# Patient Record
Sex: Female | Born: 1990 | Race: White | Hispanic: No | Marital: Single | Smoking: Former smoker
Health system: Southern US, Community
[De-identification: ages and names within clinical notes are randomized; demographics above are authoritative.]

## PROBLEM LIST (undated history)

## (undated) DIAGNOSIS — R569 Unspecified convulsions: Secondary | ICD-10-CM

## (undated) HISTORY — PX: TUMOR REMOVAL: SHX12

## (undated) HISTORY — PX: WISDOM TOOTH EXTRACTION: SHX21

## (undated) HISTORY — PX: TONSILLECTOMY: SUR1361

---
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
## 2009-12-12 ENCOUNTER — Emergency Department (HOSPITAL_COMMUNITY): Admission: EM | Admit: 2009-12-12 | Discharge: 2009-12-12 | Payer: Self-pay | Admitting: Emergency Medicine

## 2010-06-30 ENCOUNTER — Emergency Department (HOSPITAL_COMMUNITY)
Admission: EM | Admit: 2010-06-30 | Discharge: 2010-06-30 | Disposition: A | Discharge status: Home / Self Care | Payer: BC Managed Care – PPO | Attending: Emergency Medicine | Admitting: Emergency Medicine

## 2010-06-30 ENCOUNTER — Emergency Department (HOSPITAL_COMMUNITY): Payer: BC Managed Care – PPO

## 2010-06-30 DIAGNOSIS — R569 Unspecified convulsions: Secondary | ICD-10-CM | POA: Insufficient documentation

## 2010-06-30 DIAGNOSIS — F29 Unspecified psychosis not due to a substance or known physiological condition: Secondary | ICD-10-CM | POA: Insufficient documentation

## 2010-06-30 DIAGNOSIS — R110 Nausea: Secondary | ICD-10-CM | POA: Insufficient documentation

## 2010-06-30 LAB — COMPREHENSIVE METABOLIC PANEL
AST: 25 U/L (ref 0–37)
Albumin: 4.2 g/dL (ref 3.5–5.2)
Chloride: 106 mEq/L (ref 96–112)
Creatinine, Ser: 0.58 mg/dL (ref 0.4–1.2)
GFR calc Af Amer: >60 mL/min (ref >60)
Total Bilirubin: 0.7 mg/dL (ref 0.3–1.2)

## 2010-06-30 LAB — DIFFERENTIAL
Basophils Absolute: 0 10*3/uL (ref 0.0–0.1)
Basophils Relative: 0 % (ref 0–1)
Eosinophils Absolute: 0 10*3/uL (ref 0.0–0.7)
Eosinophils Relative: 0 % (ref 0–5)
Lymphocytes Relative: 9 % — ABNORMAL LOW (ref 12–46)
Lymphs Abs: 1.4 10*3/uL (ref 0.7–4.0)
Monocytes Absolute: 0.8 10*3/uL (ref 0.1–1.0)
Monocytes Relative: 5 % (ref 3–12)
Neutro Abs: 12.5 10*3/uL — ABNORMAL HIGH (ref 1.7–7.7)
Neutrophils Relative %: 85 % — ABNORMAL HIGH (ref 43–77)

## 2010-06-30 LAB — URINE MICROSCOPIC-ADD ON

## 2010-06-30 LAB — RAPID URINE DRUG SCREEN, HOSP PERFORMED
Barbiturates: NOT DETECTED
Cocaine: NOT DETECTED
Opiates: NOT DETECTED
Tetrahydrocannabinol: NOT DETECTED

## 2010-06-30 LAB — URINALYSIS, ROUTINE W REFLEX MICROSCOPIC
Bilirubin Urine: NEGATIVE
Hgb urine dipstick: NEGATIVE
Specific Gravity, Urine: 1.024 (ref 1.005–1.030)
Urine Glucose, Fasting: NEGATIVE mg/dL
pH: 6 (ref 5.0–8.0)

## 2010-06-30 LAB — CBC
MCHC: 35.3 g/dL (ref 30.0–36.0)
WBC: 14.7 10*3/uL — ABNORMAL HIGH (ref 4.0–10.5)

## 2010-06-30 LAB — SALICYLATE LEVEL: Salicylate Lvl: <4 mg/dL (ref 2.8–20.0)

## 2010-06-30 LAB — PHOSPHORUS: Phosphorus: 2.4 mg/dL (ref 2.3–4.6)

## 2010-06-30 LAB — POCT PREGNANCY, URINE: Preg Test, Ur: NEGATIVE

## 2010-06-30 LAB — ACETAMINOPHEN LEVEL: Acetaminophen (Tylenol), Serum: <10 ug/mL — ABNORMAL LOW (ref 10–30)

## 2010-06-30 MED ORDER — GADOBENATE DIMEGLUMINE 529 MG/ML IV SOLN
15.0000 mL | Freq: Once | INTRAVENOUS | Status: AC
  Administered 2010-06-30: 15 mL via INTRAVENOUS

## 2010-08-05 LAB — URINALYSIS, ROUTINE W REFLEX MICROSCOPIC
Bilirubin Urine: NEGATIVE
Ketones, ur: NEGATIVE mg/dL
Nitrite: NEGATIVE
Protein, ur: NEGATIVE mg/dL

## 2010-08-05 LAB — URINE CULTURE: Culture  Setup Time: 201107260125

## 2010-08-05 LAB — URINE MICROSCOPIC-ADD ON

## 2011-07-24 ENCOUNTER — Encounter (INDEPENDENT_AMBULATORY_CARE_PROVIDER_SITE_OTHER): Payer: BC Managed Care – PPO | Admitting: Obstetrics and Gynecology

## 2011-07-24 DIAGNOSIS — N949 Unspecified condition associated with female genital organs and menstrual cycle: Secondary | ICD-10-CM

## 2011-07-24 DIAGNOSIS — N390 Urinary tract infection, site not specified: Secondary | ICD-10-CM

## 2011-07-24 DIAGNOSIS — Z331 Pregnant state, incidental: Secondary | ICD-10-CM

## 2011-07-24 DIAGNOSIS — R300 Dysuria: Secondary | ICD-10-CM

## 2011-07-25 ENCOUNTER — Other Ambulatory Visit (INDEPENDENT_AMBULATORY_CARE_PROVIDER_SITE_OTHER): Payer: BC Managed Care – PPO

## 2011-07-25 ENCOUNTER — Encounter (INDEPENDENT_AMBULATORY_CARE_PROVIDER_SITE_OTHER): Payer: BC Managed Care – PPO | Admitting: Obstetrics and Gynecology

## 2011-07-25 DIAGNOSIS — O26849 Uterine size-date discrepancy, unspecified trimester: Secondary | ICD-10-CM

## 2011-09-18 ENCOUNTER — Telehealth: Payer: Self-pay | Admitting: Obstetrics and Gynecology

## 2011-09-18 NOTE — Telephone Encounter (Signed)
Routed to triage 

## 2011-09-20 NOTE — Telephone Encounter (Signed)
Lm on vm all labs wnl

## 2011-09-20 NOTE — Telephone Encounter (Signed)
Lm on vm tcb rgd msg 

## 2017-07-23 ENCOUNTER — Emergency Department (INDEPENDENT_AMBULATORY_CARE_PROVIDER_SITE_OTHER)
Admission: EM | Admit: 2017-07-23 | Discharge: 2017-07-23 | Disposition: A | Discharge status: Home / Self Care | Payer: Managed Care, Other (non HMO) | Source: Home / Self Care

## 2017-07-23 ENCOUNTER — Other Ambulatory Visit: Payer: Self-pay

## 2017-07-23 DIAGNOSIS — L739 Follicular disorder, unspecified: Secondary | ICD-10-CM

## 2017-07-23 DIAGNOSIS — R300 Dysuria: Secondary | ICD-10-CM

## 2017-07-23 LAB — POCT URINALYSIS DIP (MANUAL ENTRY)
BILIRUBIN UA: NEGATIVE
Glucose, UA: NEGATIVE mg/dL
NITRITE UA: NEGATIVE
Protein Ur, POC: 30 mg/dL — AB
Spec Grav, UA: 1.025 (ref 1.010–1.025)
Urobilinogen, UA: 0.2 E.U./dL
pH, UA: 6 (ref 5.0–8.0)

## 2017-07-23 MED ORDER — DOXYCYCLINE HYCLATE 100 MG PO CAPS: 100.0000 mg | ORAL_CAPSULE | Freq: Two times a day (BID) | ORAL | 0 refills | Status: DC

## 2017-07-23 NOTE — ED Provider Notes (Signed)
[facility]    CSN: [id number] Arrival date & time: 07/23/17  0906     History   Chief Complaint Chief Complaint  Patient presents with  . Dysuria    HPI Maureen Castaneda is a 27 y.o. female.   The history is provided by the patient. No language interpreter was used.  Dysuria  Pain quality:  Aching Pain severity:  Moderate Onset quality:  Gradual Timing:  Constant Progression:  Worsening Chronicity:  New Recent urinary tract infections: no   Relieved by:  Nothing Worsened by:  Nothing Ineffective treatments:  None tried Urinary symptoms comment:  Burning Associated symptoms: no abdominal pain, no fever, no vaginal discharge and no vomiting   Risk factors: not pregnant     History reviewed. No pertinent past medical history.  There are no active problems to display for this patient.   Past Surgical History:  Procedure Laterality Date  . TONSILLECTOMY    . WISDOM TOOTH EXTRACTION      OB History    No data available       Home Medications    Prior to Admission medications   Medication Sig Start Date End Date Taking? Authorizing Provider  doxycycline (VIBRAMYCIN) 100 MG capsule Take 1 capsule (100 mg total) by mouth 2 (two) times daily. 07/23/17   Elson AreasSofia, Zabria Liss K, PA-C    Family History History reviewed. No pertinent family history.  Social History Social History   Tobacco Use  . Smoking status: Never Smoker  . Smokeless tobacco: Never Used  Substance Use Topics  . Alcohol use: Yes    Frequency: Never  . Drug use: No     Allergies   Patient has no known allergies.   Review of Systems Review of Systems  Constitutional: Negative for chills and fever.  Gastrointestinal: Negative for abdominal pain and vomiting.  Genitourinary: Positive for dysuria. Negative for hematuria, menstrual problem, vaginal bleeding and vaginal discharge.  Skin: Negative for color change and rash.  All other systems reviewed and are  negative.    Physical Exam Triage Vital Signs ED Triage Vitals  Enc Vitals Group     BP 07/23/17 0950 115/76     Pulse Rate 07/23/17 0950 79     Resp --      Temp 07/23/17 0950 98.1 F (36.7 C)     Temp Source 07/23/17 0950 Oral     SpO2 07/23/17 0950 97 %     Weight 07/23/17 0951 198 lb (89.8 kg)     Height 07/23/17 0951 5\' 2"  (1.575 m)     Head Circumference --      Peak Flow --      Pain Score 07/23/17 0950 6     Pain Loc --      Pain Edu? --      Excl. in GC? --    No data found.  Updated Vital Signs BP 115/76 (BP Location: Right Arm)   Pulse 79   Temp 98.1 F (36.7 C) (Oral)   Ht 5\' 2"  (1.575 m)   Wt 198 lb (89.8 kg)   LMP 07/11/2017   SpO2 97%   BMI 36.21 kg/m   Visual Acuity Right Eye Distance:   Left Eye Distance:   Bilateral Distance:    Right Eye Near:   Left Eye Near:    Bilateral Near:     Physical Exam  Constitutional: She appears well-developed and well-nourished.  HENT:  Head: Normocephalic and atraumatic.  Eyes: Conjunctivae are  normal. Pupils are equal, round, and reactive to light.  Neck: Normal range of motion. Neck supple.  Cardiovascular: Normal rate.  Abdominal: Soft. There is no tenderness.  Genitourinary:  Genitourinary Comments: 3 small pimples labial area,  Area looks more like infected hair follicles   Musculoskeletal: Normal range of motion.  Skin: Skin is warm.     UC Treatments / Results  Labs (all labs ordered are listed, but only abnormal results are displayed) Labs Reviewed  POCT URINALYSIS DIP (MANUAL ENTRY) - Abnormal; Notable for the following components:      Result Value   Ketones, POC UA trace (5) (*)    Blood, UA trace-intact (*)    Protein Ur, POC =30 (*)    Leukocytes, UA Trace (*)    All other components within normal limits  C. TRACHOMATIS/N. GONORRHOEAE RNA  WET PREP BY MOLECULAR PROBE  URINE CULTURE  RPR  HIV ANTIBODY (ROUTINE TESTING)    EKG  EKG Interpretation None        Radiology No results found.  Procedures Procedures (including critical care time)  Medications Ordered in UC Medications - No data to display   Initial Impression / Assessment and Plan / UC Course  I have reviewed the triage vital signs and the nursing notes.  Pertinent labs & imaging results that were available during my care of the patient were reviewed by me and considered in my medical decision making (see chart for details).     MDM  Std testing ordered.  I will treat with doxycycline to cover for skin infection.  Pt counseled on possible herpes.  I advised areas will worsen if herptic  Final Clinical Impressions(s) / UC Diagnoses   Final diagnoses:  Dysuria  Folliculitis    ED Discharge Orders        Ordered    doxycycline (VIBRAMYCIN) 100 MG capsule  2 times daily     07/23/17 1035       Controlled Substance Prescriptions [place] Controlled Substance Registry consulted? Not Applicable   Elson Areas, [place] 07/23/17 4132

## 2017-07-23 NOTE — ED Triage Notes (Signed)
Started Thursday with painful urination.  Had used AZO, but not for a couple of days.  Also concerned about STD. #[id number] PW [phone]

## 2017-07-23 NOTE — Discharge Instructions (Signed)
Return if any problems.

## 2017-07-24 LAB — WET PREP BY MOLECULAR PROBE
Candida species: NOT DETECTED
MICRO NUMBER: 90281490
SPECIMEN QUALITY: ADEQUATE
Trichomonas vaginosis: NOT DETECTED

## 2017-07-24 LAB — C. TRACHOMATIS/N. GONORRHOEAE RNA
C. trachomatis RNA, TMA: NOT DETECTED
N. GONORRHOEAE RNA, TMA: NOT DETECTED

## 2017-07-24 LAB — URINE CULTURE
MICRO NUMBER:: 90281489
SPECIMEN QUALITY: ADEQUATE

## 2017-07-24 LAB — RPR: RPR: NONREACTIVE

## 2017-07-24 LAB — HIV ANTIBODY (ROUTINE TESTING W REFLEX): HIV 1&2 Ab, 4th Generation: NONREACTIVE

## 2017-07-25 ENCOUNTER — Telehealth: Payer: Self-pay

## 2017-07-25 NOTE — Telephone Encounter (Signed)
Notified patient of lab results.  Medication called into pharmacy.

## 2020-05-11 ENCOUNTER — Ambulatory Visit: Payer: Self-pay | Admitting: Physician Assistant

## 2020-05-11 ENCOUNTER — Other Ambulatory Visit: Payer: Self-pay

## 2020-05-11 ENCOUNTER — Encounter: Payer: Self-pay | Admitting: Physician Assistant

## 2020-05-11 DIAGNOSIS — Z113 Encounter for screening for infections with a predominantly sexual mode of transmission: Secondary | ICD-10-CM

## 2020-05-11 DIAGNOSIS — G40909 Epilepsy, unspecified, not intractable, without status epilepticus: Secondary | ICD-10-CM

## 2020-05-11 DIAGNOSIS — Z299 Encounter for prophylactic measures, unspecified: Secondary | ICD-10-CM

## 2020-05-11 LAB — PREGNANCY, URINE: Preg Test, Ur: NEGATIVE

## 2020-05-11 LAB — WET PREP FOR TRICH, YEAST, CLUE
Trichomonas Exam: NEGATIVE
Yeast Exam: NEGATIVE

## 2020-05-11 MED ORDER — AZITHROMYCIN 500 MG PO TABS
1000.0000 mg | ORAL_TABLET | Freq: Once | ORAL | Status: AC
  Administered 2020-05-11: 17:00:00 1000 mg via ORAL

## 2020-05-11 NOTE — Progress Notes (Signed)
  [facility] Department STI clinic/screening visit  Subjective:  Maureen Castaneda is a 29 y.o. female being seen today for an STI screening visit. The patient reports they do not have symptoms.  Patient reports that they do not desire a pregnancy in the next year.   They reported they are not interested in discussing contraception today.  Patient's last menstrual period was 04/08/2020.   Patient has the following medical conditions:  There are no problems to display for this patient.   Chief Complaint  Patient presents with  . SEXUALLY TRANSMITTED DISEASE    screening    HPI  Patient reports that she is not having any symptoms but that her partner was having symptoms and got treated with Doxycycline.  Reports that she has a seizure disorder but that she does not take any medicines for this currently.  Reports that she recently started on the Patch as her [facility] and her periods have been "weird" so would like a pregnancy test to confirm that she is not pregnant.  States last HIV test and pap were 2 years ago.   See flowsheet for further details and programmatic requirements.    The following portions of the patient's history were reviewed and updated as appropriate: allergies, current medications, past medical history, past social history, past surgical history and problem list.  Objective:  There were no vitals filed for this visit.  Physical Exam Constitutional:      General: She is not in acute distress.    Appearance: Normal appearance.  HENT:     Head: Normocephalic and atraumatic.     Comments: No nits,lice, or hair loss. No cervical, supraclavicular or axillary adenopathy.    Mouth/Throat:     Mouth: Mucous membranes are moist.     Pharynx: Oropharynx is clear. No oropharyngeal exudate or posterior oropharyngeal erythema.  Eyes:     Conjunctiva/sclera: Conjunctivae normal.  Pulmonary:     Effort: Pulmonary effort is normal.  Musculoskeletal:     Cervical  back: Neck supple. No tenderness.  Skin:    General: Skin is warm and dry.     Findings: No bruising, erythema, lesion or rash.  Neurological:     Mental Status: She is alert and oriented to person, place, and time.  Psychiatric:        Mood and Affect: Mood normal.        Behavior: Behavior normal.        Thought Content: Thought content normal.        Judgment: Judgment normal.      Assessment and Plan:  Maureen Castaneda is a 29 y.o. female presenting to the [facility] Department for STI screening  1. Screening for STD (sexually transmitted disease) Patient into clinic without symptoms. Rec condoms with all sex. Await test results.  Counseled that RN will call if needs to RTC for treatment once results are back. - Pregnancy, urine - WET PREP FOR TRICH, YEAST, CLUE - Gonococcus culture - HIV [place] [facility] - Syphilis Serology, [place] [facility] - Chlamydia/Gonorrhea [facility] [place] [facility]  2. Prophylactic measure Will cover patient for possible contact to Chlamydia or NGU with Azithromycin 1 g po DOT today. No sex for 10 days and until after partner completes treatment. RTC for re-treatment if vomits < 2 hr after taking medicine. - azithromycin (ZITHROMAX) tablet 1,000 mg     No follow-ups on file.  No future appointments.  Matt Holmes, PA

## 2020-05-11 NOTE — Progress Notes (Signed)
Post:  RN reviewed wet mount with patient. Patient treated as contact with 1 gram azithromycin. Provider orders complete.   Harvie Heck, RN

## 2020-05-16 LAB — GONOCOCCUS CULTURE

## 2020-05-25 DIAGNOSIS — A749 Chlamydial infection, unspecified: Secondary | ICD-10-CM

## 2021-03-09 ENCOUNTER — Emergency Department (HOSPITAL_BASED_OUTPATIENT_CLINIC_OR_DEPARTMENT_OTHER): Payer: BC Managed Care – PPO

## 2021-03-09 ENCOUNTER — Other Ambulatory Visit: Payer: Self-pay

## 2021-03-09 ENCOUNTER — Encounter (HOSPITAL_BASED_OUTPATIENT_CLINIC_OR_DEPARTMENT_OTHER): Payer: Self-pay

## 2021-03-09 ENCOUNTER — Emergency Department (HOSPITAL_BASED_OUTPATIENT_CLINIC_OR_DEPARTMENT_OTHER)
Admission: EM | Admit: 2021-03-09 | Discharge: 2021-03-09 | Disposition: A | Discharge status: Home / Self Care | Payer: BC Managed Care – PPO | Attending: Emergency Medicine | Admitting: Emergency Medicine

## 2021-03-09 DIAGNOSIS — R6884 Jaw pain: Secondary | ICD-10-CM | POA: Diagnosis not present

## 2021-03-09 DIAGNOSIS — S0990XA Unspecified injury of head, initial encounter: Secondary | ICD-10-CM

## 2021-03-09 DIAGNOSIS — W500XXA Accidental hit or strike by another person, initial encounter: Secondary | ICD-10-CM | POA: Insufficient documentation

## 2021-03-09 DIAGNOSIS — Y9361 Activity, american tackle football: Secondary | ICD-10-CM | POA: Insufficient documentation

## 2021-03-09 HISTORY — DX: Unspecified convulsions: R56.9

## 2021-03-09 NOTE — ED Triage Notes (Signed)
Pt reports facial injury 10/16-pain to left side of face with swelling bruising to chin-NAD-steady gait

## 2021-03-09 NOTE — Discharge Instructions (Signed)
Please continue to monitor your symptoms closely.  If you develop any new or worsening symptoms please come back to the emergency department.  It was a pleasure to meet you. 

## 2021-03-09 NOTE — ED Provider Notes (Signed)
[facility] EMERGENCY DEPARTMENT Provider Note   CSN: [id number] Arrival date & time: 03/09/21  1637     History Chief Complaint  Patient presents with   Facial Injury    Maureen Castaneda is a 30 y.o. female.  HPI Patient is a 30 year old female with a medical history as noted below.  She presents to the emergency department due to a head injury that occurred 4 days ago.  Patient states she was playing flag football and another player hit her left jaw with her head.  She states that she went down to the ground and was "dazed" but did not completely lose consciousness.  She states [facility] was evaluated [facility] ago and had an x-ray of her head and was discharged home.  She followed up with her PCP who recommended that she come to the emergency department for CT scans.  Since being struck to the head she reports recurrent daily headaches, photophobia.  She notes that she has continued to have significant left jaw pain with bruising to the left chin.  Pain worsens with chewing and jaw movement.  Denies any visual changes, numbness, or weakness.    Past Medical History:  Diagnosis Date   Seizures [facility])     Patient Active Problem List   Diagnosis Date Noted   Seizure disorder (HCC) 05/11/2020    Past Surgical History:  Procedure Laterality Date   TONSILLECTOMY     WISDOM TOOTH EXTRACTION       OB History   No obstetric history on file.     No family history on file.  Social History   Tobacco Use   Smoking status: Never   Smokeless tobacco: Never  Vaping Use   Vaping Use: Never used  Substance Use Topics   Alcohol use: Yes    Comment: occ   Drug use: Not Currently    Home Medications Prior to Admission medications   Medication Sig Start Date End Date Taking? Authorizing Provider  Burr Medico 150-35 MCG/24HR transdermal patch 1 patch once a week. 03/21/20   [provider]    Allergies    Patient has no known allergies.  Review of  Systems   Review of Systems  Eyes:  Positive for photophobia. Negative for visual disturbance.  Musculoskeletal:  Negative for back pain and neck pain.  Neurological:  Positive for light-headedness and headaches. Negative for syncope, weakness and numbness.   Physical Exam Updated Vital Signs BP 123/76 (BP Location: Left Arm)   Pulse 72   Temp 98.5 F (36.9 C) (Oral)   Resp 18   Ht 5\' 2"  (1.575 m)   Wt 84.8 kg   LMP 02/23/2021   SpO2 100%   BMI 34.19 kg/m   Physical Exam Vitals and nursing note reviewed.  Constitutional:      General: She is not in acute distress.    Appearance: Normal appearance. She is not ill-appearing, toxic-appearing or diaphoretic.  HENT:     Head: Normocephalic.     Right Ear: Tympanic membrane, ear canal and external ear normal. There is no impacted cerumen.     Left Ear: Tympanic membrane, ear canal and external ear normal. There is no impacted cerumen.     Nose: Nose normal.     Mouth/Throat:     Mouth: Mucous membranes are moist.     Pharynx: Oropharynx is clear. No oropharyngeal exudate or posterior oropharyngeal erythema.     Comments: Moderate tenderness noted diffusely along the  left mandibular region.  Ecchymosis noted to the left side of the chin.  Mild edema noted in the region.  No malocclusion. Eyes:     General: No scleral icterus.       Right eye: No discharge.        Left eye: No discharge.     Extraocular Movements: Extraocular movements intact.     Conjunctiva/sclera: Conjunctivae normal.  Neck:     Comments: No midline C, T, or L-spine tenderness. Cardiovascular:     Rate and Rhythm: Normal rate and regular rhythm.     Pulses: Normal pulses.     Heart sounds: Normal heart sounds. No murmur heard.   No friction rub. No gallop.  Pulmonary:     Effort: Pulmonary effort is normal. No respiratory distress.     Breath sounds: Normal breath sounds. No stridor. No wheezing, rhonchi or rales.  Abdominal:     General: Abdomen is  flat.     Tenderness: There is no abdominal tenderness.  Musculoskeletal:        General: Normal range of motion.     Cervical back: Normal range of motion and neck supple. No tenderness.  Skin:    General: Skin is warm and dry.  Neurological:     General: No focal deficit present.     Mental Status: She is alert and oriented to person, place, and time.     Comments: Patient is oriented to person, place, and time. Patient phonates in clear, complete, and coherent sentences. Strength is 5/5 in all four extremities. Distal sensation intact in all four extremities.  Psychiatric:        Mood and Affect: Mood normal.        Behavior: Behavior normal.   ED Results / Procedures / Treatments   Labs (all labs ordered are listed, but only abnormal results are displayed) Labs Reviewed - No data to display  EKG None  Radiology CT Head Wo Contrast  Result Date: 03/09/2021 CLINICAL DATA:  Football injury 5 days ago, facial bruising and swelling, headache EXAM: CT HEAD WITHOUT CONTRAST TECHNIQUE: Contiguous axial images were obtained from the base of the skull through the vertex without intravenous contrast. COMPARISON:  06/30/2010 FINDINGS: Brain: No acute infarct or hemorrhage. Lateral ventricles and midline structures are unremarkable. No acute extra-axial fluid collections. No mass effect. Vascular: No hyperdense vessel or unexpected calcification. Skull: Normal. Negative for fracture or focal lesion. Sinuses/Orbits: No acute finding. Other: None. IMPRESSION: 1. No acute intracranial process. Electronically Signed   By: Sharlet Salina M.D.   On: 03/09/2021 18:53   CT Maxillofacial Wo Contrast  Result Date: 03/09/2021 CLINICAL DATA:  Football injury, left facial bruising and swelling EXAM: CT MAXILLOFACIAL WITHOUT CONTRAST TECHNIQUE: Multidetector CT imaging of the maxillofacial structures was performed. Multiplanar CT image reconstructions were also generated. COMPARISON:  None. FINDINGS:  Osseous: No fracture or mandibular dislocation. No destructive process. Orbits: Negative. No traumatic or inflammatory finding. Sinuses: Clear. Soft tissues: Negative. Limited intracranial: No significant or unexpected finding. IMPRESSION: 1. No acute facial bone fracture. Electronically Signed   By: Sharlet Salina M.D.   On: 03/09/2021 18:56    Procedures Procedures   Medications Ordered in ED Medications - No data to display  ED Course  I have reviewed the triage vital signs and the nursing notes.  Pertinent labs & imaging results that were available during my care of the patient were reviewed by me and considered in my medical decision making (see chart for  details).    MDM Rules/Calculators/A&P                          Pt is a 30 y.o. female who presents to the emergency department due to head injury that occurred 4 days ago.  Imaging: CT scan of the head without contrast shows no acute intracranial process. CT maxillofacial without contrast shows no acute facial bone fracture.  I, Placido Sou, PA-C, personally reviewed and evaluated these images and lab results as part of my medical decision-making.  Physical exam concerning for moderate tenderness along the left mandibular region with associated ecchymosis and mild swelling to the left chin.  No malocclusion.  Neurological exam is reassuring.  I obtained CT scans of the head and maxillofacial regions which were reassuring.  Feel that her recurrent headaches and photophobia are likely postconcussive syndrome.  Recommended application of ice as well as continued use of Tylenol/ibuprofen for her symptoms.  Feel that the patient is stable for discharge at this time and she is agreeable.  We discussed return precautions in length.  Her questions were answered and she was amicable at the time of discharge.  Note: Portions of this report may have been transcribed using voice recognition software. Every effort was made to ensure  accuracy; however, inadvertent computerized transcription errors may be present.   Final Clinical Impression(s) / ED Diagnoses Final diagnoses:  Traumatic injury of head, initial encounter  Jaw pain   Rx / DC Orders ED Discharge Orders     None        Placido Sou, PA-C 03/09/21 1917    Benjiman Core, MD 03/10/21 [id number]

## 2021-03-09 NOTE — ED Notes (Signed)
Patient transported to CT 

## 2021-10-24 ENCOUNTER — Ambulatory Visit: Payer: Self-pay

## 2021-11-09 ENCOUNTER — Ambulatory Visit: Payer: Self-pay

## 2021-11-09 ENCOUNTER — Telehealth: Payer: Self-pay | Admitting: Family Medicine

## 2021-11-09 NOTE — Telephone Encounter (Signed)
Pt has been rescheduled for 11/16/21 but she states that she is out of her birth control. Please call.

## 2021-11-16 ENCOUNTER — Encounter: Payer: Self-pay | Admitting: Advanced Practice Midwife

## 2021-11-16 ENCOUNTER — Ambulatory Visit (LOCAL_COMMUNITY_HEALTH_CENTER): Payer: Self-pay | Admitting: Advanced Practice Midwife

## 2021-11-16 VITALS — BP 108/64 | Ht 62.0 in | Wt 184.2 lb

## 2021-11-16 DIAGNOSIS — Z3202 Encounter for pregnancy test, result negative: Secondary | ICD-10-CM

## 2021-11-16 DIAGNOSIS — G40909 Epilepsy, unspecified, not intractable, without status epilepticus: Secondary | ICD-10-CM

## 2021-11-16 DIAGNOSIS — A749 Chlamydial infection, unspecified: Secondary | ICD-10-CM | POA: Insufficient documentation

## 2021-11-16 DIAGNOSIS — R030 Elevated blood-pressure reading, without diagnosis of hypertension: Secondary | ICD-10-CM | POA: Insufficient documentation

## 2021-11-16 DIAGNOSIS — E669 Obesity, unspecified: Secondary | ICD-10-CM | POA: Insufficient documentation

## 2021-11-16 DIAGNOSIS — Z3009 Encounter for other general counseling and advice on contraception: Secondary | ICD-10-CM

## 2021-11-16 DIAGNOSIS — F17200 Nicotine dependence, unspecified, uncomplicated: Secondary | ICD-10-CM

## 2021-11-16 DIAGNOSIS — Z30016 Encounter for initial prescription of transdermal patch hormonal contraceptive device: Secondary | ICD-10-CM

## 2021-11-16 DIAGNOSIS — F32A Depression, unspecified: Secondary | ICD-10-CM | POA: Insufficient documentation

## 2021-11-16 LAB — HM HIV SCREENING LAB: HM HIV Screening: NEGATIVE

## 2021-11-16 MED ORDER — XULANE 150-35 MCG/24HR TD PTWK: 1.0000 | MEDICATED_PATCH | TRANSDERMAL | 4 refills | Status: AC

## 2021-11-16 NOTE — Progress Notes (Signed)
Pt here for PE, STD check and BC.  Pt notified of negative PT, Wet prep and normal Hgb.  Pt given FP packet.  The patient was dispensed Xulane Patches today. I provided counseling today regarding the medication. We discussed the medication, the side effects and when to call clinic. Patient given the opportunity to ask questions. Questions answered.

## 2021-11-16 NOTE — Progress Notes (Signed)
[facility] DEPARTMENT [facility] 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  The patient is currently using No Method - Other Reason for pregnancy prevention. Patient reports   does not want a pregnancy in the next year.     report they are looking for a method that provides High efficacy at preventing pregnancy  Patient has the following medical conditions has Seizure disorder (HCC); Obesity BMI=33.6; and Elevated blood pressure reading on 11/16/21 128/91 on their problem list.  Chief Complaint  Patient presents with   Contraception    PE, Pap smear, STD check and [facility]    Patient reports here for physical and more patches. Put last patch on 10/29/21 and took it off 11/04/21 and was supposed to put another on 11/11/21 but had no more. Last sex 11/13/21 without condom; with current partner x 2 years; 1 partner in last 3 mo. Just moved here from [facility] 08/2021 and working 35 hours/wk living with her boyfriend. LMP 11/02/21. Last seizure 2015 and pt states is supposed to be taking meds but has no insurance. Last dental exam 10 years ago. Last vaped 2021. Last MJ 3 years ago. Last ETOH 11/12/21 (2 mixed drinks) q weekend. Last PE 04/25/21. Last pap 06/29/20 neg HPV neg. PHQ-9=12 Does the patient meet criteria for HBV testing? No  Criteria:  -Household, sexual or needle sharing contact with HBV -History of drug use -HIV positive -Those with known Hep C   Health Maintenance Due  Topic Date Due   COVID-19 Vaccine (1) Never done   Hepatitis C Screening  Never done   PAP SMEAR-Modifier  Never done    Review of Systems  HENT:  Positive for sore throat (with cough and SOB x 1 mo; referred to primary care MD).   Respiratory:  Positive for cough (x1 month with sore  throat; referred to primary care MD) and shortness of breath (with cough and sore throat x 1 mo; referred to primary care MD).   Neurological:  Positive for headaches (2x/wk in different places on head relieved with sleep and meds).    The following portions of the patient's history were reviewed and updated as appropriate: allergies, current medications, past family history, past medical history, past social history, past surgical history and problem list. Problem list updated.   See flowsheet for other program required questions.  Objective:   Vitals:   11/16/21 1547 11/16/21 1616  BP: (!) 128/91 108/64  Weight: 184 lb 3.2 oz (83.6 kg)   Height: 5\' 2"  (1.575 m)     Physical Exam Constitutional:      Appearance: Normal appearance. She is obese.  HENT:     Head: Normocephalic and atraumatic.     Mouth/Throat:     Mouth: Mucous membranes are moist.     Comments: Last dental exam 10 years ago Eyes:     Conjunctiva/sclera: Conjunctivae normal.  Neck:     Thyroid: No thyroid mass, thyromegaly or thyroid tenderness.  Cardiovascular:     Rate and Rhythm: Normal rate and regular rhythm.  Pulmonary:     Effort: Pulmonary effort is normal.     Breath sounds: Normal breath sounds.  Chest:  Breasts:    Right: Normal.     Left: Normal.  Abdominal:  Palpations: Abdomen is soft.     Comments: Soft without masses, sl tenderness RLQ to deep palpation  Genitourinary:    General: Normal vulva.     Exam position: Lithotomy position.     Vagina: Vaginal discharge (white creamy leukorrhea, ph<4.5) present.     Cervix: Normal.     Uterus: Normal.      Adnexa: Right adnexa normal and left adnexa normal.     Rectum: External hemorrhoid (flesh colored) present.  Musculoskeletal:        General: Normal range of motion.     Cervical back: Normal range of motion and neck supple.  Skin:    General: Skin is warm and dry.  Neurological:     Mental Status: She is alert.  Psychiatric:         Mood and Affect: Mood normal.       Assessment and Plan:  Maureen Castaneda is a 31 y.o. female presenting to the [facility] Department for an initial annual wellness/contraceptive visit  Contraception counseling: Reviewed options based on patient desire and reproductive life plan. Patient is interested in Contraceptive Patch. This was provided to the patient today.  if not why not clearly documented  Risks, benefits, and typical effectiveness rates were reviewed.  Questions were answered.  Written information was also given to the patient to review.    The patient will follow up in  5 months for surveillance of BP.  The patient was told to call with any further questions, or with any concerns about this method of contraception.  Emphasized use of condoms 100% of the time for STI prevention.  Need for ECP was assessed. Patient reported > 120 hours .  Reviewed options and patient desired No method of ECP, declined all    1. Family planning Please give pt dental list Please give pt primary care MD list Please give pt contact info for Kathreen Cosier, LCSW Treat wet mount per standing orders Immunization nurse consult  - WET PREP FOR TRICH, YEAST, CLUE - Hemoglobin, venipuncture - Chlamydia/Gonorrhea [place] [facility] - HIV [place] [facility] - Syphilis Serology, [place] [facility] - Pregnancy, urine  2. Encounter for initial prescription of transdermal patch hormonal contraceptive device Xulane x 5 mo if PT negative today Then needs to return for BP check before given more xulane  3. Obesity, unspecified classification, unspecified obesity type, unspecified whether serious comorbidity present   4. Elevated blood pressure reading on 11/16/21 128/91 Repeat=108/64  5. Seizure disorder (HCC) Pt states is supossed to be on meds but has no insurance    No follow-ups on file.  No future appointments.  Alberteen Spindle, CNM

## 2021-11-17 LAB — WET PREP FOR TRICH, YEAST, CLUE
Trichomonas Exam: NEGATIVE
Yeast Exam: NEGATIVE

## 2021-11-17 LAB — PREGNANCY, URINE: Preg Test, Ur: NEGATIVE

## 2021-11-17 LAB — HEMOGLOBIN, FINGERSTICK: Hemoglobin: 13.6 g/dL (ref 11.1–15.9)

## 2021-12-21 ENCOUNTER — Ambulatory Visit: Payer: Self-pay | Admitting: Licensed Clinical Social Worker

## 2021-12-21 DIAGNOSIS — F4323 Adjustment disorder with mixed anxiety and depressed mood: Secondary | ICD-10-CM | POA: Insufficient documentation
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
## 2021-12-28 IMAGING — CT CT HEAD W/O CM
3 series · 14 of 47 positions shown, 16 images · non-contrast
Comparison: 06/30/2010

CLINICAL DATA: Football injury 5 days ago, facial bruising and
swelling, headache

EXAM:
CT HEAD WITHOUT CONTRAST
TECHNIQUE: Contiguous axial images were obtained from the base of the skull
through the vertex without intravenous contrast.

[Series 6: head wo · axial · 0.52mm/px · z∈[+118,+252]mm · 8 of 33 slices shown, 10 images]
[im 3/33  brain]
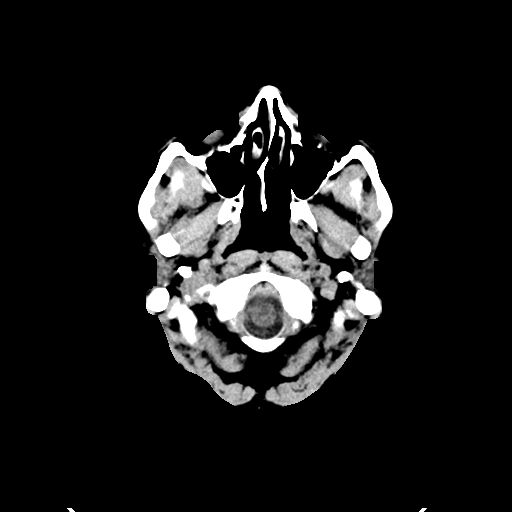
[im 3/33  bone]
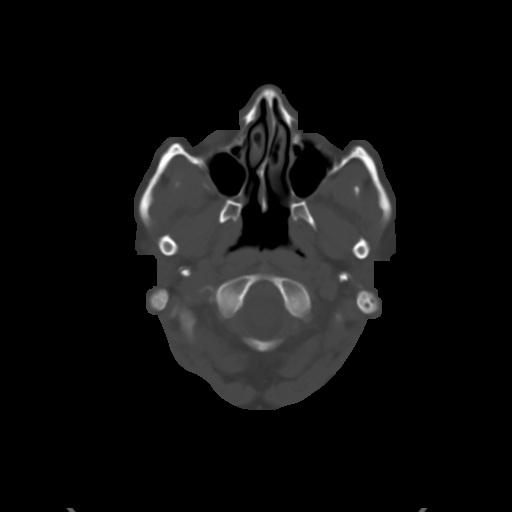
[im 7/33  brain]
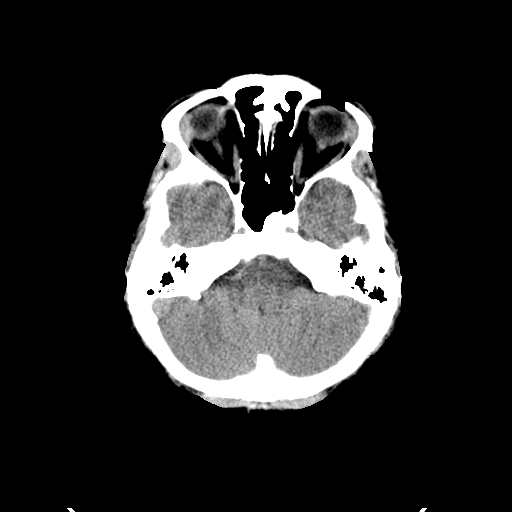
[im 10/33  brain]
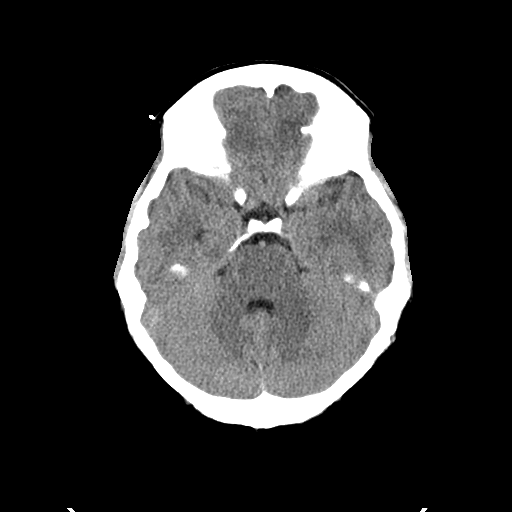
[im 15/33  brain]
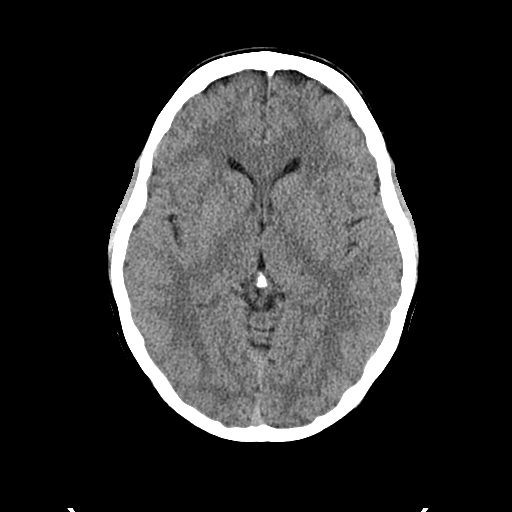
[im 18/33  brain]
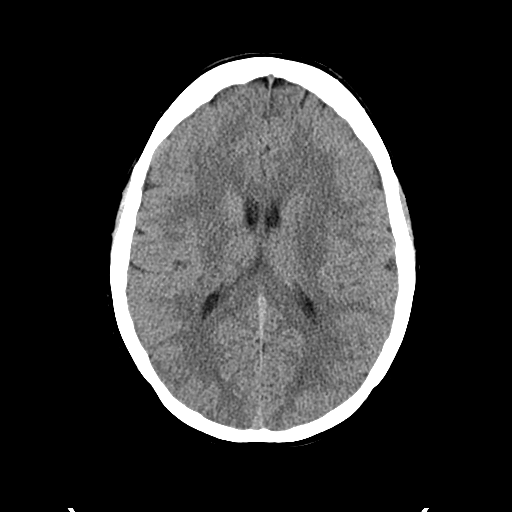
[im 18/33  bone]
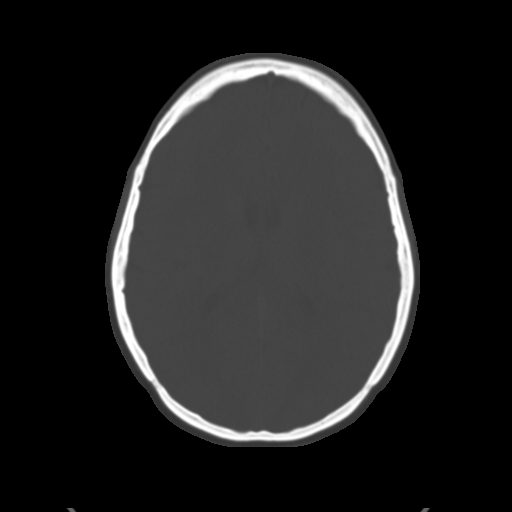
[im 23/33  brain]
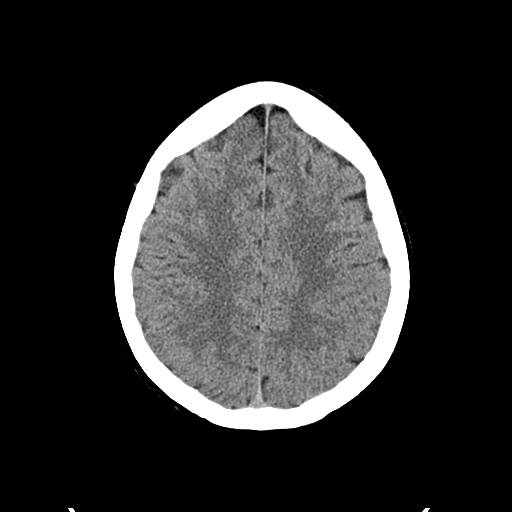
[im 26/33  brain]
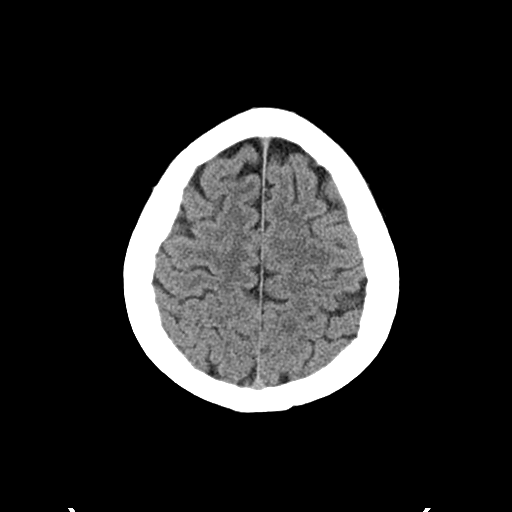
[im 30/33  brain]
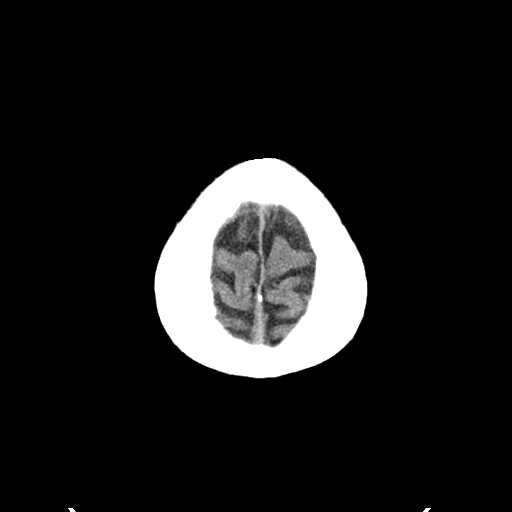

[Series 7: cor head wo · coronal · 0.32mm/px · 3 of 84 slices shown]
[im 28/84  brain]
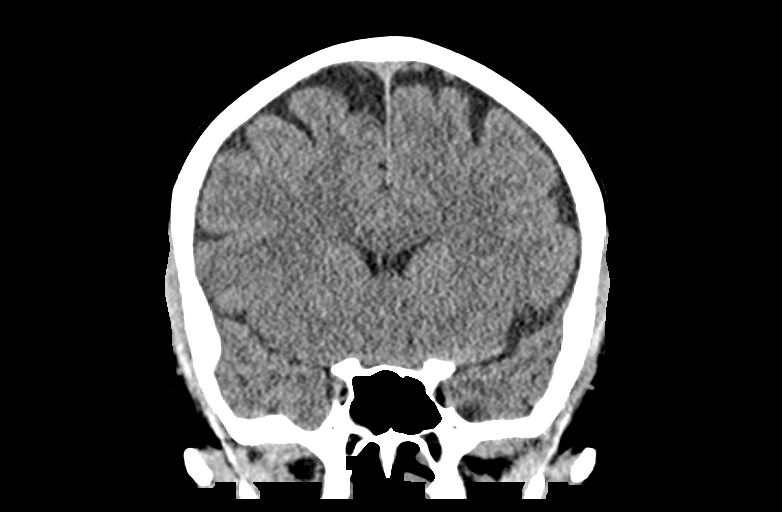
[im 37/84  brain]
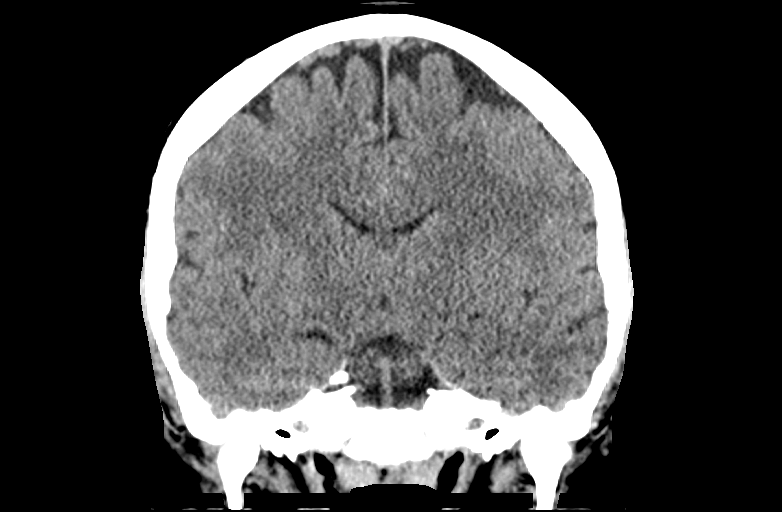
[im 47/84  brain]
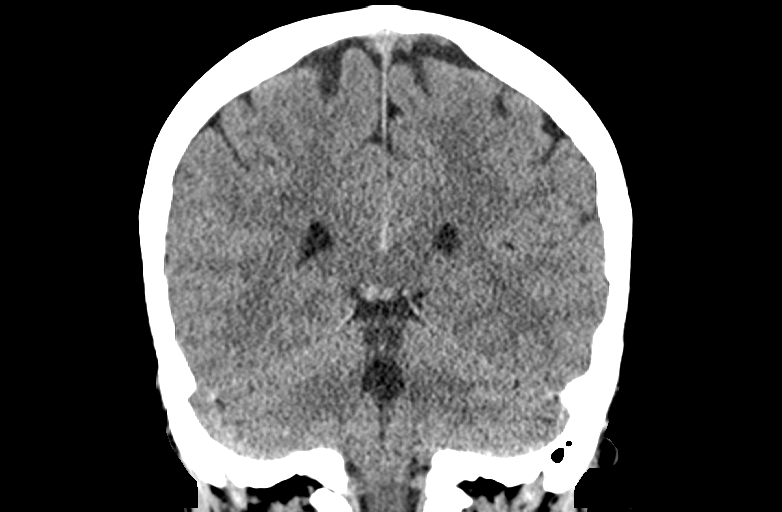

[Series 8: sag head wo · sagittal · 0.32mm/px · 3 of 84 slices shown]
[im 28/84  brain]
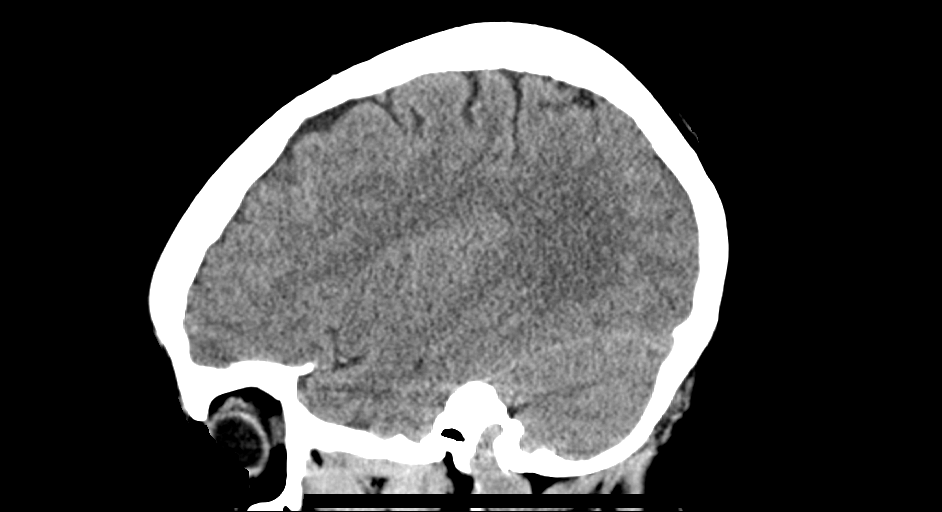
[im 42/84  brain]
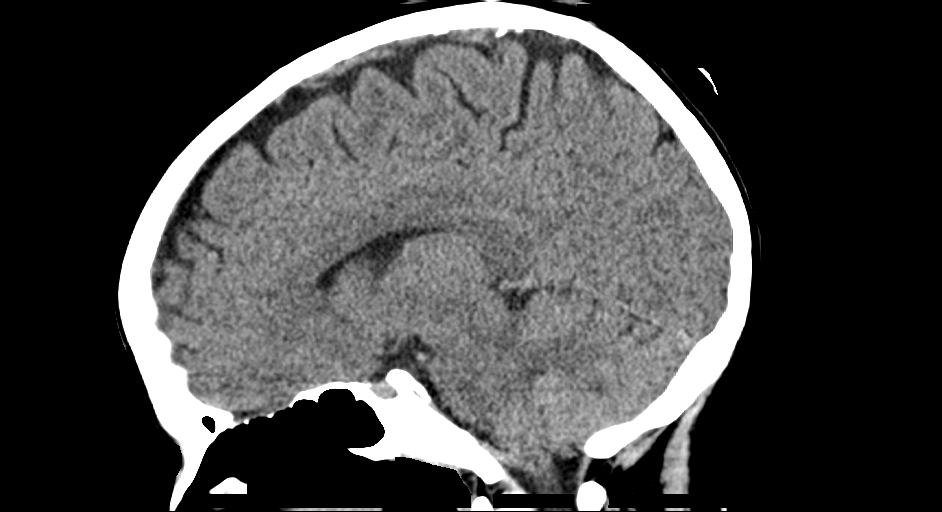
[im 56/84  brain]
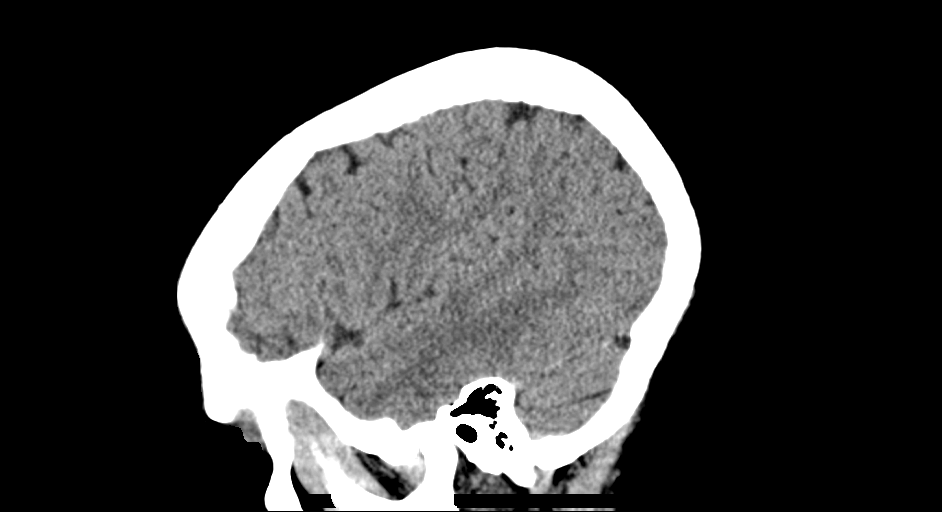

[14 of 47 positions shown; findings below may reference images not displayed]

FINDINGS: Brain: No acute infarct or hemorrhage. Lateral ventricles and
midline structures are unremarkable. No acute extra-axial fluid
collections. No mass effect.

Vascular: No hyperdense vessel or unexpected calcification.

Skull: Normal. Negative for fracture or focal lesion.

Sinuses/Orbits: No acute finding.

Other: None.
IMPRESSION: 1. No acute intracranial process.

## 2022-01-04 ENCOUNTER — Ambulatory Visit: Payer: Self-pay | Admitting: Licensed Clinical Social Worker

## 2022-01-04 DIAGNOSIS — F4323 Adjustment disorder with mixed anxiety and depressed mood: Secondary | ICD-10-CM

## 2022-01-04 NOTE — Progress Notes (Signed)
Counselor/Therapist Progress Note  Patient ID: Maureen Castaneda, MRN: [id number],    Date: 01/04/2022  Time Spent: 45 minutes   Treatment Type: Psychotherapy  Reported Symptoms:  Anxiety, anxious thoughts; overall mood improvement   Mental Status Exam:  Appearance:   Casual     Behavior:  Appropriate and Sharing  Motor:  Normal  Speech/Language:   Clear and Coherent and Normal Rate  Affect:  Appropriate, Congruent, and Full Range  Mood:  euthymic  Thought process:  normal  Thought content:    WNL  Sensory/Perceptual disturbances:    WNL  Orientation:  oriented to person, place, time/date, situation, and day of week  Attention:  Good  Concentration:  Good  Memory:  WNL  Fund of knowledge:   Good  Insight:    Fair  Judgment:   Good  Impulse Control:  Good   Risk Assessment: Danger to Self:  No Self-injurious Behavior: No Danger to Others: No Duty to Warn:no Physical Aggression / Violence:No  Access to Firearms a concern: No  Gang Involvement:No   Subjective: Patient was engaged and cooperative throughout the session using time effectively to discuss thoughts, feelings, and treatment plan.  Patient voices continued motivation for treatment and understanding of CBTs. Patient is likely to benefit from future treatment because  remains motivated to decrease symptoms and improve functioning.   Interventions: Cognitive Behavioral Therapy  Checked in with patient and reviewed previous session, including assessment and goal of treatment. Provided psychoeducation and a rational for CBTs blended approach. Explored patient's goal of treatment and worked collaboratively to develop CBTs treatment plan. Provided support through active listening, validation of feelings, and highlighted patient's strengths.  Diagnosis:   ICD-10-CM   1. Adjustment disorder with mixed anxiety and depressed mood  F43.23      Plan:  Patient's goal of treatment is to be able to control her  thoughts.  Treatment Target: Understand the relationship between thoughts, emotions, and behaviors  Psychoeducation on CBT model   Oriented the client to the therapeutic approach Teach the connection between thoughts, emotions, and behaviors  Treatment Target: Increase realistic balanced thinking -to learn how to replace thinking with thoughts that are more accurate or helpful Explore patient's thoughts, beliefs, automatic thoughts, assumptions  Identify and replace unhelpful thinking patterns (upsetting ideas, self-talk and mental images) Process distress and allow for emotional release  Questioning and challenging thoughts Cognitive reappraisal  Restructuring, Socratic questioning  Treatment Target: Increase psychological flexibility using ACT stratgies (Acceptance, defusion, observer self/perspective-taking, present moment awareness, values and committed action) Teach mindfulness skills  Psychoeducation about cognitive fusion- Hand as thoughts metaphor  Increase awareness of control vs. acceptance Reduce avoidance of certain emotions or situations Discuss "unhooking" and emphasize that thoughts and feelings do not always lead to action Increasing self-knowledge  Getting in touch with the observant self  Values-guided behavioral change  Future Appointments  Date Time Provider Department Center  01/18/2022  2:50 PM Kathreen Cosier, LCSW [facility] None    Kathreen Cosier, LCSW

## 2022-01-18 ENCOUNTER — Ambulatory Visit: Payer: Self-pay | Admitting: Licensed Clinical Social Worker

## 2022-06-12 ENCOUNTER — Encounter: Payer: Self-pay | Admitting: *Deleted

## 2022-06-12 ENCOUNTER — Other Ambulatory Visit: Payer: Self-pay

## 2022-06-12 ENCOUNTER — Emergency Department
Admission: EM | Admit: 2022-06-12 | Discharge: 2022-06-13 | Disposition: A | Discharge status: Home / Self Care | Payer: BLUE CROSS/BLUE SHIELD | Attending: Emergency Medicine | Admitting: Emergency Medicine

## 2022-06-12 DIAGNOSIS — Z87891 Personal history of nicotine dependence: Secondary | ICD-10-CM | POA: Diagnosis not present

## 2022-06-12 DIAGNOSIS — R45851 Suicidal ideations: Secondary | ICD-10-CM | POA: Diagnosis not present

## 2022-06-12 DIAGNOSIS — Z1152 Encounter for screening for COVID-19: Secondary | ICD-10-CM | POA: Diagnosis not present

## 2022-06-12 DIAGNOSIS — G40909 Epilepsy, unspecified, not intractable, without status epilepticus: Secondary | ICD-10-CM

## 2022-06-12 DIAGNOSIS — F4323 Adjustment disorder with mixed anxiety and depressed mood: Secondary | ICD-10-CM | POA: Diagnosis present

## 2022-06-12 DIAGNOSIS — F32A Depression, unspecified: Secondary | ICD-10-CM | POA: Insufficient documentation

## 2022-06-12 LAB — COMPREHENSIVE METABOLIC PANEL
ALT: 31 U/L (ref 0–44)
AST: 32 U/L (ref 15–41)
Albumin: 4.4 g/dL (ref 3.5–5.0)
Alkaline Phosphatase: 49 U/L (ref 38–126)
Anion gap: 11 (ref 5–15)
BUN: 10 mg/dL (ref 6–20)
CO2: 22 mmol/L (ref 22–32)
Calcium: 9.2 mg/dL (ref 8.9–10.3)
Chloride: 104 mmol/L (ref 98–111)
Creatinine, Ser: 0.57 mg/dL (ref 0.44–1.00)
GFR, Estimated: >60 mL/min (ref >60)
Glucose, Bld: 115 mg/dL — ABNORMAL HIGH (ref 70–99)
Potassium: 3.4 mmol/L — ABNORMAL LOW (ref 3.5–5.1)
Sodium: 137 mmol/L (ref 135–145)
Total Bilirubin: 0.6 mg/dL (ref 0.3–1.2)
Total Protein: 8 g/dL (ref 6.5–8.1)

## 2022-06-12 LAB — URINALYSIS, ROUTINE W REFLEX MICROSCOPIC
Bilirubin Urine: NEGATIVE
Glucose, UA: NEGATIVE mg/dL
Hgb urine dipstick: NEGATIVE
Ketones, ur: 20 mg/dL — AB
Leukocytes,Ua: NEGATIVE
Nitrite: NEGATIVE
Protein, ur: NEGATIVE mg/dL
Specific Gravity, Urine: 1.013 (ref 1.005–1.030)
pH: 7 (ref 5.0–8.0)

## 2022-06-12 LAB — RESP PANEL BY RT-PCR (RSV, FLU A&B, COVID)  RVPGX2
Influenza A by PCR: NEGATIVE
Influenza B by PCR: NEGATIVE
Resp Syncytial Virus by PCR: NEGATIVE
SARS Coronavirus 2 by RT PCR: NEGATIVE

## 2022-06-12 LAB — URINE DRUG SCREEN, QUALITATIVE (ARMC ONLY)
Amphetamines, Ur Screen: NOT DETECTED
Barbiturates, Ur Screen: NOT DETECTED
Benzodiazepine, Ur Scrn: NOT DETECTED
Cannabinoid 50 Ng, Ur ~~LOC~~: NOT DETECTED
Cocaine Metabolite,Ur ~~LOC~~: NOT DETECTED
MDMA (Ecstasy)Ur Screen: NOT DETECTED
Methadone Scn, Ur: NOT DETECTED
Opiate, Ur Screen: NOT DETECTED
Phencyclidine (PCP) Ur S: NOT DETECTED
Tricyclic, Ur Screen: NOT DETECTED

## 2022-06-12 LAB — CBC WITH DIFFERENTIAL/PLATELET
Abs Immature Granulocytes: 0.04 10*3/uL (ref 0.00–0.07)
Basophils Absolute: 0 10*3/uL (ref 0.0–0.1)
Basophils Relative: 0 %
Eosinophils Absolute: 0 10*3/uL (ref 0.0–0.5)
Eosinophils Relative: 0 %
HCT: 40.9 % (ref 36.0–46.0)
Hemoglobin: 13.7 g/dL (ref 12.0–15.0)
Immature Granulocytes: 0 %
Lymphocytes Relative: 30 %
Lymphs Abs: 2.7 10*3/uL (ref 0.7–4.0)
MCH: 31.7 pg (ref 26.0–34.0)
MCHC: 33.5 g/dL (ref 30.0–36.0)
MCV: 94.7 fL (ref 80.0–100.0)
Monocytes Absolute: 0.5 10*3/uL (ref 0.1–1.0)
Monocytes Relative: 6 %
Neutro Abs: 5.7 10*3/uL (ref 1.7–7.7)
Neutrophils Relative %: 64 %
Platelets: 273 10*3/uL (ref 150–400)
RBC: 4.32 MIL/uL (ref 3.87–5.11)
RDW: 12.6 % (ref 11.5–15.5)
WBC: 8.9 10*3/uL (ref 4.0–10.5)
nRBC: 0 % (ref 0.0–0.2)

## 2022-06-12 LAB — PREGNANCY, URINE: Preg Test, Ur: NEGATIVE

## 2022-06-12 LAB — SALICYLATE LEVEL: Salicylate Lvl: <7 mg/dL — ABNORMAL LOW (ref 7.0–30.0)

## 2022-06-12 LAB — ETHANOL: Alcohol, Ethyl (B): <10 mg/dL (ref <10)

## 2022-06-12 LAB — ACETAMINOPHEN LEVEL: Acetaminophen (Tylenol), Serum: <10 ug/mL — ABNORMAL LOW (ref 10–30)

## 2022-06-12 NOTE — ED Provider Notes (Signed)
11:45 PM  Pt here with thoughts of wanting to harm herself.  Seen and cleared by psychiatry and given outpatient resources however patient does not feel comfortable with this plan and feels unstable going home.  Will reconsult psychiatry to reevaluate and allow patient to rest overnight and likely be reassessed in the morning.   Cheyann Blecha, Delice Bison, DO 06/12/22 [id number]

## 2022-06-12 NOTE — ED Notes (Signed)
Writer went to go over discharge paperwork with patient. Patient is very upset and crying that she is being d/c'ed. She feels like what she said was being disregarded about her poking herself with something sharp to just feel, She states it took a lot for her to come up here and it feels like a waste of time that we are not trying to help her and she is scared to go home and have a break down worse than she is already having. EDP Dr. Ellender Hose and Psych NP Ysidro Evert made aware.

## 2022-06-12 NOTE — ED Notes (Signed)
Reports uncle passing away on birthday last week. Has been feeling what sounds to be hopeless. No SI but what she reports as intrusive thoughts of walking into traffic because she feels nobody would care. Pt seeking help as she knows she does not want to feel this way

## 2022-06-12 NOTE — ED Notes (Signed)
VOL/Psych Consult ordered/Pending/Moved to BHU-4

## 2022-06-12 NOTE — ED Notes (Signed)
Pt. To BHU from ED ambulatory without difficulty, to room  BHU 4. Report from [facility]. Pt. Is alert and oriented, warm and dry in no distress. Pt. Denies SI, HI, and AVH. Pt. Calm and cooperative. Pt. Made aware of security cameras and Q15 minute rounds. Pt. Encouraged to let Nursing staff know of any concerns or needs.   ENVIRONMENTAL ASSESSMENT Potentially harmful objects out of patient reach: Yes.   Personal belongings secured: Yes.   Patient dressed in hospital provided attire only: Yes.   Plastic bags out of patient reach: Yes.   Patient care equipment (cords, cables, call bells, lines, and drains) shortened, removed, or accounted for: Yes.   Equipment and supplies removed from bottom of stretcher: Yes.   Potentially toxic materials out of patient reach: Yes.   Sharps container removed or out of patient reach: Yes.

## 2022-06-12 NOTE — ED Notes (Signed)
Gray/red hoodie sweatshirt Black pants Brown pocketbook Gray coat Pink socks White sneakers Pink/black hair band [employer] bra 4 beaded bracelets Tan underwear  Black hair tie Black mask

## 2022-06-12 NOTE — ED Provider Notes (Signed)
[facility] Provider Note    Event Date/Time   First MD Initiated Contact with Patient 06/12/22 2023     (approximate)   History   Psychiatric Evaluation   HPI  Maureen Castaneda is a 32 y.o. female  here with suicidal ideation and depression. Pt reports that sx have been progressively worsening over months. She has a h/o depression and does not see a Teacher, music. She has recently begun seeing a therapist/psychologist who recommended she come to the ED. Denies any other complaints. She has a h/o seizures btu does not take any medications. Denies any ilicit drug use. She takes Adderall which was recently started by her PCP.       Physical Exam   Triage Vital Signs: ED Triage Vitals  Enc Vitals Group     BP 06/12/22 1729 (!) 131/116     Pulse Rate 06/12/22 1729 (!) 122     Resp 06/12/22 1729 20     Temp 06/12/22 1729 98.6 F (37 C)     Temp Source 06/12/22 1729 Oral     SpO2 06/12/22 1729 100 %     Weight 06/12/22 1726 185 lb (83.9 kg)     Height 06/12/22 1726 5\' 2"  (1.575 m)     Head Circumference --      Peak Flow --      Pain Score 06/12/22 1726 0     Pain Loc --      Pain Edu? --      Excl. in [place]? --     Most recent vital signs: Vitals:   06/12/22 1729  BP: (!) 131/116  Pulse: (!) 122  Resp: 20  Temp: 98.6 F (37 C)  SpO2: 100%     General: Awake, no distress.  CV:  Good peripheral perfusion. Resp:  Normal effort.  Abd:  No distention.  Other:  Calm, cooperative, pleasant.   ED Results / Procedures / Treatments   Labs (all labs ordered are listed, but only abnormal results are displayed) Labs Reviewed  URINALYSIS, ROUTINE W REFLEX MICROSCOPIC - Abnormal; Notable for the following components:      Result Value   Color, Urine YELLOW (*)    APPearance HAZY (*)    Ketones, ur 20 (*)    All other components within normal limits  COMPREHENSIVE METABOLIC PANEL - Abnormal; Notable for the following components:   Potassium  3.4 (*)    Glucose, Bld 115 (*)    All other components within normal limits  SALICYLATE LEVEL - Abnormal; Notable for the following components:   Salicylate Lvl <9.5 (*)    All other components within normal limits  ACETAMINOPHEN LEVEL - Abnormal; Notable for the following components:   Acetaminophen (Tylenol), Serum <10 (*)    All other components within normal limits  RESP PANEL BY RT-PCR (RSV, FLU A&B, COVID)  [id number]  CBC WITH DIFFERENTIAL/PLATELET  URINE DRUG SCREEN, QUALITATIVE ([facility] ONLY)  ETHANOL  PREGNANCY, URINE     EKG    RADIOLOGY    I also independently reviewed and agree with radiologist interpretations.   PROCEDURES:  Critical Care performed: No   MEDICATIONS ORDERED IN ED: Medications - No data to display   IMPRESSION / MDM / [place] / ED COURSE  I reviewed the triage vital signs and the nursing notes.  Differential diagnosis includes, but is not limited to, mood d/o, depression, substance-induced mood d/o, adjustment d/o.  Patient's presentation is most consistent with acute presentation with potential threat to life or bodily function.  32 yo F here with worsening depression, suicidal ideation. Here volutnarily. CBC without leukocytosis, CMP unremarkable. No apparent medical etiology or trigger. TTS/Psychiatry consulted.   FINAL CLINICAL IMPRESSION(S) / ED DIAGNOSES   Final diagnoses:  Depression, unspecified depression type     Rx / DC Orders   ED Discharge Orders     None        Note:  This document was prepared using Dragon voice recognition software and may include unintentional dictation errors.   Duffy Bruce, MD 06/12/22 2128

## 2022-06-12 NOTE — Consult Note (Incomplete)
[facility] Face-to-Face Psychiatry Consult   Reason for Consult: Psychiatric Evaluation  Referring Physician: Dr. Erma Heritage Patient Identification: Maureen Castaneda MRN:  [id number] Principal Diagnosis: <principal problem not specified> Diagnosis:  Active Problems:   Seizure disorder [facility])   Depression dx'd 2010   Adjustment disorder with mixed anxiety and depressed mood   Total Time spent with patient: 1 hour  Subjective: "I needed to get myself out of that space." Maureen Castaneda is a 32 y.o. female patient presented to [facility] ED via POV voluntarily. The patient is seen shared that she has been through a lot. The patient shared that she is in a relationship that it is not going well. The patient shared that she lost her uncle and she has been feeling very sad. The patient share that she has a therapist that she sees biweekly via Zoom. The patient stated that she does not want anyone to know she was in the ED. She stated "I only told my therapist and on of my good friend from work." The patient share that she had a needle and she was poking herself. The patient stated "I was not trying to harm myself I wanted to feel pain." The patient stated she is not on any medications and when she was on Lamictal her mood was better. "I was taking it for my seizures. It was very expensive. I have the worse insurance."   The patient was seen face-to-face by this provider; chart reviewed and consulted with Dr. Erma Heritage on 06/12/2022 due to the care of the patient. It was discussed with the EDP  that the patient does not meet the criteria to be admitted to the psychiatric inpatient unit. The patient was asked if she thinks she needed to be admitted inpatient? The patient on three occasions stated she was fine and she could be discharge. A safety plan was discussed with the patient. The patient stated "I am not going to harm myself". The patient does not have a history of past suicide attempt. On evaluation the patient is  alert and oriented x 4 calm, cooperative, and mood-congruent with a flat affect. The patient does not appear to be responding to internal or external stimuli. Neither is the patient presenting with any delusional thinking. The patient denies auditory or visual hallucinations. The patient denies any suicidal, homicidal, or self-harm ideations. The patient is not presenting with any psychotic or paranoid behaviors. During an encounter with the patient, she was able to answer questions appropriately.  HPI: Per Dr. Erma Heritage, Maureen Castaneda is a 32 y.o. female  here with suicidal ideation and depression. Pt reports that sx have been progressively worsening over months. She has a h/o depression and does not see a Therapist, sports. She has recently begun seeing a therapist/psychologist who recommended she come to the ED. Denies any other complaints. She has a h/o seizures btu does not take any medications. Denies any ilicit drug use. She takes Adderall which was recently started by her PCP.    Past Psychiatric History:  Seizures (HCC)  Risk to Self:   Risk to Others:   Prior Inpatient Therapy:   Prior Outpatient Therapy:    Past Medical History:  Past Medical History:  Diagnosis Date  . Seizures (HCC)     Past Surgical History:  Procedure Laterality Date  . TONSILLECTOMY    . TUMOR REMOVAL    . WISDOM TOOTH EXTRACTION     Family History:  Family History  Problem Relation Age of Onset  .  Migraines Mother   . Congenital heart disease Mother   . Depression Mother   . Anxiety disorder Mother   . ADD / ADHD Mother   . Anxiety disorder Father   . ADD / ADHD Sister   . Anxiety disorder Sister   . Depression Sister   . Diabetes Maternal Grandmother   . Cancer Maternal Grandfather        kidney  . Clotting disorder Maternal Grandfather   . Lung disease Paternal Grandfather   . Kidney disease Paternal Grandfather   . Cancer Maternal Aunt        Brain  . Cancer Paternal Aunt        Brain and  cervical  . Cancer Paternal Aunt        Breast  . Cancer Cousin        Thyroid   Family Psychiatric  History: History reviewed. No pertinent family psychiatric history Social History:  Social History   Substance and Sexual Activity  Alcohol Use Yes  . Alcohol/week: 4.0 standard drinks of alcohol  . Types: 4 Standard drinks or equivalent per week   Comment: last use 11/12/21 q weekend     Social History   Substance and Sexual Activity  Drug Use Not Currently  . Types: Marijuana   Comment: last use 2021    Social History   Socioeconomic History  . Marital status: Single    Spouse name: Not on file  . Number of children: Not on file  . Years of education: Not on file  . Highest education level: Not on file  Occupational History  . Not on file  Tobacco Use  . Smoking status: Former    Types: E-cigarettes  . Smokeless tobacco: Never  Vaping Use  . Vaping Use: Never used  Substance and Sexual Activity  . Alcohol use: Yes    Alcohol/week: 4.0 standard drinks of alcohol    Types: 4 Standard drinks or equivalent per week    Comment: last use 11/12/21 q weekend  . Drug use: Not Currently    Types: Marijuana    Comment: last use 2021  . Sexual activity: Yes    Partners: Male    Birth control/protection: Patch  Other Topics Concern  . Not on file  Social History Narrative  . Not on file   Social Determinants of Health   Financial Resource Strain: Not on file  Food Insecurity: Not on file  Transportation Needs: Not on file  Physical Activity: Not on file  Stress: Not on file  Social Connections: Not on file   Additional Social History:    Allergies:  No Known Allergies  Labs:  Results for orders placed or performed during the hospital encounter of 06/12/22 (from the past 48 hour(s))  Urinalysis, Routine w reflex microscopic     Status: Abnormal   Collection Time: 06/12/22  5:29 PM  Result Value Ref Range   Color, Urine YELLOW (A) YELLOW   APPearance HAZY  (A) CLEAR   Specific Gravity, Urine 1.013 1.005 - 1.030   pH 7.0 5.0 - 8.0   Glucose, UA NEGATIVE NEGATIVE mg/dL   Hgb urine dipstick NEGATIVE NEGATIVE   Bilirubin Urine NEGATIVE NEGATIVE   Ketones, ur 20 (A) NEGATIVE mg/dL   Protein, ur NEGATIVE NEGATIVE mg/dL   Nitrite NEGATIVE NEGATIVE   Leukocytes,Ua NEGATIVE NEGATIVE    Comment: Performed at [facility], [address] [place], [place] [address]  Comprehensive metabolic panel     Status:  Abnormal   Collection Time: 06/12/22  5:29 PM  Result Value Ref Range   Sodium 137 135 - 145 mmol/L   Potassium 3.4 (L) 3.5 - 5.1 mmol/L   Chloride 104 98 - 111 mmol/L   CO2 22 22 - 32 mmol/L   Glucose, Bld 115 (H) 70 - 99 mg/dL    Comment: Glucose reference range applies only to samples taken after fasting for at least 8 hours.   BUN 10 6 - 20 mg/dL   Creatinine, Ser 0.57 0.44 - 1.00 mg/dL   Calcium 9.2 8.9 - 10.3 mg/dL   Total Protein 8.0 6.5 - 8.1 g/dL   Albumin 4.4 3.5 - 5.0 g/dL   AST 32 15 - 41 U/L   ALT 31 0 - 44 U/L   Alkaline Phosphatase 49 38 - 126 U/L   Total Bilirubin 0.6 0.3 - 1.2 mg/dL   GFR, Estimated >60 >60 mL/min    Comment: (NOTE) Calculated using the CKD-EPI Creatinine Equation (2021)    Anion gap 11 5 - 15    Comment: Performed at [facility], [address] [place], [place] [address]  CBC with Differential     Status: None   Collection Time: 06/12/22  5:29 PM  Result Value Ref Range   WBC 8.9 4.0 - 10.5 K/uL   RBC 4.32 3.87 - 5.11 MIL/uL   Hemoglobin 13.7 12.0 - 15.0 g/dL   HCT 40.9 36.0 - 46.0 %   MCV 94.7 80.0 - 100.0 fL   MCH 31.7 26.0 - 34.0 pg   MCHC 33.5 30.0 - 36.0 g/dL   RDW 12.6 11.5 - 15.5 %   Platelets 273 150 - 400 K/uL   nRBC 0.0 0.0 - 0.2 %   Neutrophils Relative % 64 %   Neutro Abs 5.7 1.7 - 7.7 K/uL   Lymphocytes Relative 30 %   Lymphs Abs 2.7 0.7 - 4.0 K/uL   Monocytes Relative 6 %   Monocytes Absolute 0.5 0.1 - 1.0 K/uL   Eosinophils Relative 0 %   Eosinophils  Absolute 0.0 0.0 - 0.5 K/uL   Basophils Relative 0 %   Basophils Absolute 0.0 0.0 - 0.1 K/uL   Immature Granulocytes 0 %   Abs Immature Granulocytes 0.04 0.00 - 0.07 K/uL    Comment: Performed at [facility], [address] [place], [place] [address]  Urine Drug Screen, Qualitative     Status: None   Collection Time: 06/12/22  5:29 PM  Result Value Ref Range   Tricyclic, Ur Screen NONE DETECTED NONE DETECTED   Amphetamines, Ur Screen NONE DETECTED NONE DETECTED   MDMA (Ecstasy)Ur Screen NONE DETECTED NONE DETECTED   Cocaine Metabolite,Ur [place] NONE DETECTED NONE DETECTED   Opiate, Ur Screen NONE DETECTED NONE DETECTED   Phencyclidine (PCP) Ur S NONE DETECTED NONE DETECTED   Cannabinoid 50 Ng, Ur [place] NONE DETECTED NONE DETECTED   Barbiturates, Ur Screen NONE DETECTED NONE DETECTED   Benzodiazepine, Ur Scrn NONE DETECTED NONE DETECTED   Methadone Scn, Ur NONE DETECTED NONE DETECTED    Comment: (NOTE) Tricyclics + metabolites, urine    Cutoff 1000 ng/mL Amphetamines + metabolites, urine  Cutoff 1000 ng/mL MDMA (Ecstasy), urine              Cutoff 500 ng/mL Cocaine Metabolite, urine          Cutoff 300 ng/mL Opiate + metabolites, urine        Cutoff 300 ng/mL Phencyclidine (PCP), urine  Cutoff 25 ng/mL Cannabinoid, urine                 Cutoff 50 ng/mL Barbiturates + metabolites, urine  Cutoff 200 ng/mL Benzodiazepine, urine              Cutoff 200 ng/mL Methadone, urine                   Cutoff 300 ng/mL  The urine drug screen provides only a preliminary, unconfirmed analytical test result and should not be used for non-medical purposes. Clinical consideration and professional judgment should be applied to any positive drug screen result due to possible interfering substances. A more specific alternate chemical method must be used in order to obtain a confirmed analytical result. Gas chromatography / mass spectrometry (GC/MS) is the preferred confirm atory  method. Performed at [facility], [address] [place], [place] [address]   Ethanol     Status: None   Collection Time: 06/12/22  5:29 PM  Result Value Ref Range   Alcohol, Ethyl (B) <10 <10 mg/dL    Comment: (NOTE) Lowest detectable limit for serum alcohol is 10 mg/dL.  For medical purposes only. Performed at [facility], [address] [place], [place] [id number]   Salicylate level     Status: Abnormal   Collection Time: 06/12/22  5:29 PM  Result Value Ref Range   Salicylate Lvl [id number] (L) 7.0 - 30.0 mg/dL    Comment: Performed at [facility], [address] [place], [place] [address]  Acetaminophen level     Status: Abnormal   Collection Time: 06/12/22  5:29 PM  Result Value Ref Range   Acetaminophen (Tylenol), Serum <10 (L) 10 - 30 ug/mL    Comment: (NOTE) Therapeutic concentrations vary significantly. A range of 10-30 ug/mL  may be an effective concentration for many patients. However, some  are best treated at concentrations outside of this range. Acetaminophen concentrations >150 ug/mL at 4 hours after ingestion  and >50 ug/mL at 12 hours after ingestion are often associated with  toxic reactions.  Performed at [facility], [address] [place], [place] [address]   Pregnancy, urine     Status: None   Collection Time: 06/12/22  5:29 PM  Result Value Ref Range   Preg Test, Ur NEGATIVE NEGATIVE    Comment: Performed at [facility], [address] [place], [place] [address]  Resp panel by RT-PCR (RSV, Flu A&B, Covid) Anterior Nasal Swab     Status: None   Collection Time: 06/12/22  6:46 PM   Specimen: Anterior Nasal Swab  Result Value Ref Range   SARS Coronavirus 2 by RT PCR NEGATIVE NEGATIVE    Comment: (NOTE) SARS-CoV-2 target nucleic acids are NOT DETECTED.  The SARS-CoV-2 RNA is generally detectable in upper respiratory specimens during the acute phase of infection. The lowest concentration of  SARS-CoV-2 viral copies this assay can detect is 138 copies/mL. A negative result does not preclude SARS-Cov-2 infection and should not be used as the sole basis for treatment or other patient management decisions. A negative result may occur with  improper specimen collection/handling, submission of specimen other than nasopharyngeal swab, presence of viral mutation(s) within the areas targeted by this assay, and inadequate number of viral copies(<138 copies/mL). A negative result must be combined with clinical observations, patient history, and epidemiological information. The expected result is Negative.  Fact Sheet for Patients:  EntrepreneurPulse.com.au  Fact Sheet for Healthcare Providers:  SeriousBroker.it  This test is no t yet approved or cleared by the [place] and  has been authorized for detection and/or diagnosis of SARS-CoV-2 by FDA under an Emergency Use Authorization (EUA). This EUA will remain  in effect (meaning this test can be used) for the duration of the COVID-19 declaration under Section 564(b)(1) of the Act, 21 U.S.C.section 360bbb-3(b)(1), unless the authorization is terminated  or revoked sooner.       Influenza A by PCR NEGATIVE NEGATIVE   Influenza B by PCR NEGATIVE NEGATIVE    Comment: (NOTE) The Xpert Xpress SARS-CoV-2/FLU/RSV plus assay is intended as an aid in the diagnosis of influenza from Nasopharyngeal swab specimens and should not be used as a sole basis for treatment. Nasal washings and aspirates are unacceptable for Xpert Xpress SARS-CoV-2/FLU/RSV testing.  Fact Sheet for Patients: BloggerCourse.com  Fact Sheet for Healthcare Providers: SeriousBroker.it  This test is not yet approved or cleared by the [place] FDA and has been authorized for detection and/or diagnosis of SARS-CoV-2 by FDA under an Emergency Use Authorization (EUA).  This EUA will remain in effect (meaning this test can be used) for the duration of the COVID-19 declaration under Section 564(b)(1) of the Act, 21 U.S.C. section 360bbb-3(b)(1), unless the authorization is terminated or revoked.     Resp Syncytial Virus by PCR NEGATIVE NEGATIVE    Comment: (NOTE) Fact Sheet for Patients: BloggerCourse.com  Fact Sheet for Healthcare Providers: SeriousBroker.it  This test is not yet approved or cleared by the [place] FDA and has been authorized for detection and/or diagnosis of SARS-CoV-2 by FDA under an Emergency Use Authorization (EUA). This EUA will remain in effect (meaning this test can be used) for the duration of the COVID-19 declaration under Section 564(b)(1) of the Act, 21 U.S.C. section 360bbb-3(b)(1), unless the authorization is terminated or revoked.  Performed at [facility], [address] [place], [place] [address]     No current facility-administered medications for this encounter.   Current Outpatient Medications  Medication Sig Dispense Refill  . norelgestromin-ethinyl estradiol [facility]) 150-35 MCG/24HR transdermal patch Place 1 patch onto the skin once a week. 3 patch 4    Musculoskeletal: Strength & Muscle Tone: within normal limits Gait & Station: normal Patient leans: N/A  Psychiatric Specialty Exam:  Presentation  General Appearance:  Appropriate for Environment  Eye Contact: Good  Speech: Clear and Coherent  Speech Volume: Normal  Handedness: Right   Mood and Affect  Mood: Depressed  Affect: Depressed; Congruent   Thought Process  Thought Processes: Coherent  Descriptions of Associations:Intact  Orientation:Full (Time, Place and Person)  Thought Content:Logical  History of Schizophrenia/Schizoaffective disorder:No data recorded Duration of Psychotic Symptoms:No data recorded Hallucinations:Hallucinations:  None  Ideas of Reference:None  Suicidal Thoughts:Suicidal Thoughts: No  Homicidal Thoughts:Homicidal Thoughts: No   Sensorium  Memory: Immediate Good; Recent Good; Remote Good  Judgment: Good  Insight: Good   Executive Functions  Concentration: Good  Attention Span: Good  Recall: Good  Fund of Knowledge: Good  Language: Good   Psychomotor Activity  Psychomotor Activity: Psychomotor Activity: Normal   Assets  Assets: Communication Skills; Desire for Improvement; Resilience; Social Support   Sleep  Sleep: Sleep: Fair Number of Hours of Sleep: 8   Physical Exam: Physical Exam Vitals and nursing note reviewed.  Constitutional:      Appearance: Normal appearance. She is normal weight.  HENT:     Head: Normocephalic and atraumatic.     Right Ear: External  ear normal.     Left Ear: External ear normal.     Nose: Nose normal.     Mouth/Throat:     Mouth: Mucous membranes are moist.  Cardiovascular:     Rate and Rhythm: Tachycardia present.  Pulmonary:     Effort: Pulmonary effort is normal.  Musculoskeletal:        General: Normal range of motion.     Cervical back: Normal range of motion and neck supple.  Neurological:     General: No focal deficit present.     Mental Status: She is alert and oriented to person, place, and time.  Psychiatric:        Attention and Perception: Attention and perception normal.        Mood and Affect: Mood is anxious and depressed. Affect is flat.        Speech: Speech normal.        Behavior: Behavior normal. Behavior is cooperative.        Thought Content: Thought content normal.        Judgment: Judgment normal.    ROS Blood pressure (!) 131/116, pulse (!) 122, temperature 98.6 F (37 C), temperature source Oral, resp. rate 20, height 5\' 2"  (1.575 m), weight 83.9 kg, last menstrual period 05/28/2022, SpO2 100 %. Body mass index is 33.84 kg/m.  Treatment Plan Summary: Plan   Patient does not meet the  criteria for psychiatric inpatient admission  Disposition: No evidence of imminent risk to self or others at present.   Patient does not meet criteria for psychiatric inpatient admission. Supportive therapy provided about ongoing stressors.  Caroline Sauger, NP 06/12/2022 10:59 PM

## 2022-06-12 NOTE — ED Triage Notes (Signed)
Pt here for psych eval.  Pt is VOL.  Pt reports SI   denies HI.  Pt denies etoh or drug use.  Pt states life is a lot now.  My uncle died on my birthday.  Pt tearful   pt cooperative.

## 2022-06-12 NOTE — ED Notes (Signed)
Pt has nipple piercings and nose piercing that are still in place.  Pt states they will not come out.

## 2022-06-12 NOTE — ED Provider Triage Note (Addendum)
Emergency Medicine Provider Triage Evaluation Note  SERAPHIM AFFINITO , a 32 y.o. female  was evaluated in triage.  Pt complains of patient here to talk to psychiatry about anxiety and depression surrounding a family members death. This occurred on her birthday 5 days ago. Reports her counselor recommended she be seen in ED. Not under IVC. Patient is having SI but no HI. Tearful in triage  Review of Systems  Positive: Anxiety, depression, SI Negative:  HI  Physical Exam  There were no vitals taken for this visit. Gen:   Awake, no distress   Resp:  Normal effort  MSK:   Moves extremities without difficulty  Other:    Medical Decision Making  Medically screening exam initiated at 5:26 PM.  Appropriate orders placed.  WANELL LORENZI was informed that the remainder of the evaluation will be completed by another provider, this initial triage assessment does not replace that evaluation, and the importance of remaining in the ED until their evaluation is complete.  Screening labs   Brynda Peon 06/12/22 1726    Darletta Moll, PA-C 06/12/22 1727

## 2022-06-12 NOTE — Consult Note (Signed)
[facility] Face-to-Face Psychiatry Consult   Reason for Consult: Psychiatric Evaluation  Referring Physician: Dr. Erma Heritage Patient Identification: Maureen Castaneda MRN:  [id number] Principal Diagnosis: <principal problem not specified> Diagnosis:  Active Problems:   Seizure disorder [facility])   Depression dx'd 2010   Adjustment disorder with mixed anxiety and depressed mood   Total Time spent with patient: 1 hour  Subjective: "I needed to get myself out of that space." Maureen Castaneda is a 32 y.o. female patient presented to [facility] ED via POV voluntarily. The patient shared that she has been through a lot. The patient shared that she is in a relationship that is not going well. The patient shared that she lost her uncle, and she has been feeling sad. The patient shared that she is in graduate school. The patient shared that she has a therapist that she sees biweekly via Zoom. The patient said she did not want anyone to know she was in the ED. She stated, "I only told my therapist and one of my good friend from work." The patient shared that she had a needle and she was poking herself. The patient stated, "I was not trying to harm myself; I wanted to feel pain." The patient said she is not on any medications, and when she was on Lamictal, her mood was better. "I was taking it for my seizures. It was costly. I have the worst insurance."   This provider saw The patient face-to-face; the chart was reviewed, and Dr. Erma Heritage was consulted on 06/12/2022 due to the patient's care. It was discussed with the EDP  that the patient does not meet the criteria to be admitted to the psychiatric inpatient unit. The patient was asked if she thought she needed to be admitted inpatient. On three occasions, the patient stated she was fine and could be discharged. A safety plan was discussed with the patient. The patient said, "I am not going to harm myself." The patient does not have a history of past suicide attempts. On  evaluation, the patient is alert and oriented x 4, calm, cooperative, and mood-congruent with a flat affect. The patient does not appear to be responding to internal or external stimuli. Neither is the patient presenting with any delusional thinking. The patient denies auditory or visual hallucinations. The patient denies any suicidal, homicidal, or self-harm ideations. The patient is not presenting with any psychotic or paranoid behaviors. During an encounter with the patient, she could answer questions appropriately.  HPI: Per Dr. Erma Heritage, Maureen Castaneda is a 32 y.o. female  here with suicidal ideation and depression. Pt reports that sx have been progressively worsening over months. She has a h/o depression and does not see a Therapist, sports. She has recently begun seeing a therapist/psychologist who recommended she come to the ED. Denies any other complaints. She has a h/o seizures btu does not take any medications. Denies any ilicit drug use. She takes Adderall which was recently started by her PCP.    Past Psychiatric History:  Seizures (HCC)  Risk to Self:   Risk to Others:   Prior Inpatient Therapy:   Prior Outpatient Therapy:    Past Medical History:  Past Medical History:  Diagnosis Date   Seizures (HCC)     Past Surgical History:  Procedure Laterality Date   TONSILLECTOMY     TUMOR REMOVAL     WISDOM TOOTH EXTRACTION     Family History:  Family History  Problem Relation Age of Onset   Migraines Mother  Congenital heart disease Mother    Depression Mother    Anxiety disorder Mother    ADD / ADHD Mother    Anxiety disorder Father    ADD / ADHD Sister    Anxiety disorder Sister    Depression Sister    Diabetes Maternal Grandmother    Cancer Maternal Grandfather        kidney   Clotting disorder Maternal Grandfather    Lung disease Paternal Grandfather    Kidney disease Paternal Grandfather    Cancer Maternal Aunt        Brain   Cancer Paternal Aunt        Brain  and cervical   Cancer Paternal Aunt        Breast   Cancer Cousin        Thyroid   Family Psychiatric  History: History reviewed. No pertinent family psychiatric history Social History:  Social History   Substance and Sexual Activity  Alcohol Use Yes   Alcohol/week: 4.0 standard drinks of alcohol   Types: 4 Standard drinks or equivalent per week   Comment: last use 11/12/21 q weekend     Social History   Substance and Sexual Activity  Drug Use Not Currently   Types: Marijuana   Comment: last use 2021    Social History   Socioeconomic History   Marital status: Single    Spouse name: Not on file   Number of children: Not on file   Years of education: Not on file   Highest education level: Not on file  Occupational History   Not on file  Tobacco Use   Smoking status: Former    Types: E-cigarettes   Smokeless tobacco: Never  Vaping Use   Vaping Use: Never used  Substance and Sexual Activity   Alcohol use: Yes    Alcohol/week: 4.0 standard drinks of alcohol    Types: 4 Standard drinks or equivalent per week    Comment: last use 11/12/21 q weekend   Drug use: Not Currently    Types: Marijuana    Comment: last use 2021   Sexual activity: Yes    Partners: Male    Birth control/protection: Patch  Other Topics Concern   Not on file  Social History Narrative   Not on file   Social Determinants of Health   Financial Resource Strain: Not on file  Food Insecurity: Not on file  Transportation Needs: Not on file  Physical Activity: Not on file  Stress: Not on file  Social Connections: Not on file   Additional Social History:    Allergies:  No Known Allergies  Labs:  Results for orders placed or performed during the hospital encounter of 06/12/22 (from the past 48 hour(s))  Urinalysis, Routine w reflex microscopic     Status: Abnormal   Collection Time: 06/12/22  5:29 PM  Result Value Ref Range   Color, Urine YELLOW (A) YELLOW   APPearance HAZY (A) CLEAR    Specific Gravity, Urine 1.013 1.005 - 1.030   pH 7.0 5.0 - 8.0   Glucose, UA NEGATIVE NEGATIVE mg/dL   Hgb urine dipstick NEGATIVE NEGATIVE   Bilirubin Urine NEGATIVE NEGATIVE   Ketones, ur 20 (A) NEGATIVE mg/dL   Protein, ur NEGATIVE NEGATIVE mg/dL   Nitrite NEGATIVE NEGATIVE   Leukocytes,Ua NEGATIVE NEGATIVE    Comment: Performed at [facility], [address] [place], [place] [address]  Comprehensive metabolic panel     Status: Abnormal   Collection Time:  06/12/22  5:29 PM  Result Value Ref Range   Sodium 137 135 - 145 mmol/L   Potassium 3.4 (L) 3.5 - 5.1 mmol/L   Chloride 104 98 - 111 mmol/L   CO2 22 22 - 32 mmol/L   Glucose, Bld 115 (H) 70 - 99 mg/dL    Comment: Glucose reference range applies only to samples taken after fasting for at least 8 hours.   BUN 10 6 - 20 mg/dL   Creatinine, Ser 0.57 0.44 - 1.00 mg/dL   Calcium 9.2 8.9 - 10.3 mg/dL   Total Protein 8.0 6.5 - 8.1 g/dL   Albumin 4.4 3.5 - 5.0 g/dL   AST 32 15 - 41 U/L   ALT 31 0 - 44 U/L   Alkaline Phosphatase 49 38 - 126 U/L   Total Bilirubin 0.6 0.3 - 1.2 mg/dL   GFR, Estimated >60 >60 mL/min    Comment: (NOTE) Calculated using the CKD-EPI Creatinine Equation (2021)    Anion gap 11 5 - 15    Comment: Performed at [facility], [address] [place], [place] [address]  CBC with Differential     Status: None   Collection Time: 06/12/22  5:29 PM  Result Value Ref Range   WBC 8.9 4.0 - 10.5 K/uL   RBC 4.32 3.87 - 5.11 MIL/uL   Hemoglobin 13.7 12.0 - 15.0 g/dL   HCT 40.9 36.0 - 46.0 %   MCV 94.7 80.0 - 100.0 fL   MCH 31.7 26.0 - 34.0 pg   MCHC 33.5 30.0 - 36.0 g/dL   RDW 12.6 11.5 - 15.5 %   Platelets 273 150 - 400 K/uL   nRBC 0.0 0.0 - 0.2 %   Neutrophils Relative % 64 %   Neutro Abs 5.7 1.7 - 7.7 K/uL   Lymphocytes Relative 30 %   Lymphs Abs 2.7 0.7 - 4.0 K/uL   Monocytes Relative 6 %   Monocytes Absolute 0.5 0.1 - 1.0 K/uL   Eosinophils Relative 0 %   Eosinophils Absolute 0.0  0.0 - 0.5 K/uL   Basophils Relative 0 %   Basophils Absolute 0.0 0.0 - 0.1 K/uL   Immature Granulocytes 0 %   Abs Immature Granulocytes 0.04 0.00 - 0.07 K/uL    Comment: Performed at [facility], [address] [place], [place] [address]  Urine Drug Screen, Qualitative     Status: None   Collection Time: 06/12/22  5:29 PM  Result Value Ref Range   Tricyclic, Ur Screen NONE DETECTED NONE DETECTED   Amphetamines, Ur Screen NONE DETECTED NONE DETECTED   MDMA (Ecstasy)Ur Screen NONE DETECTED NONE DETECTED   Cocaine Metabolite,Ur [place] NONE DETECTED NONE DETECTED   Opiate, Ur Screen NONE DETECTED NONE DETECTED   Phencyclidine (PCP) Ur S NONE DETECTED NONE DETECTED   Cannabinoid 50 Ng, Ur [place] NONE DETECTED NONE DETECTED   Barbiturates, Ur Screen NONE DETECTED NONE DETECTED   Benzodiazepine, Ur Scrn NONE DETECTED NONE DETECTED   Methadone Scn, Ur NONE DETECTED NONE DETECTED    Comment: (NOTE) Tricyclics + metabolites, urine    Cutoff 1000 ng/mL Amphetamines + metabolites, urine  Cutoff 1000 ng/mL MDMA (Ecstasy), urine              Cutoff 500 ng/mL Cocaine Metabolite, urine          Cutoff 300 ng/mL Opiate + metabolites, urine        Cutoff 300 ng/mL Phencyclidine (PCP), urine  Cutoff 25 ng/mL Cannabinoid, urine                 Cutoff 50 ng/mL Barbiturates + metabolites, urine  Cutoff 200 ng/mL Benzodiazepine, urine              Cutoff 200 ng/mL Methadone, urine                   Cutoff 300 ng/mL  The urine drug screen provides only a preliminary, unconfirmed analytical test result and should not be used for non-medical purposes. Clinical consideration and professional judgment should be applied to any positive drug screen result due to possible interfering substances. A more specific alternate chemical method must be used in order to obtain a confirmed analytical result. Gas chromatography / mass spectrometry (GC/MS) is the preferred confirm atory method. Performed at  [facility], [address] [place], [place] [address]   Ethanol     Status: None   Collection Time: 06/12/22  5:29 PM  Result Value Ref Range   Alcohol, Ethyl (B) <10 <10 mg/dL    Comment: (NOTE) Lowest detectable limit for serum alcohol is 10 mg/dL.  For medical purposes only. Performed at [facility], [address] [place], [place] [id number]   Salicylate level     Status: Abnormal   Collection Time: 06/12/22  5:29 PM  Result Value Ref Range   Salicylate Lvl Q000111Q (L) 7.0 - 30.0 mg/dL    Comment: Performed at [facility], [address] [place], [place] [address]  Acetaminophen level     Status: Abnormal   Collection Time: 06/12/22  5:29 PM  Result Value Ref Range   Acetaminophen (Tylenol), Serum <10 (L) 10 - 30 ug/mL    Comment: (NOTE) Therapeutic concentrations vary significantly. A range of 10-30 ug/mL  may be an effective concentration for many patients. However, some  are best treated at concentrations outside of this range. Acetaminophen concentrations >150 ug/mL at 4 hours after ingestion  and >50 ug/mL at 12 hours after ingestion are often associated with  toxic reactions.  Performed at [facility], [address] [place], [place] [address]   Pregnancy, urine     Status: None   Collection Time: 06/12/22  5:29 PM  Result Value Ref Range   Preg Test, Ur NEGATIVE NEGATIVE    Comment: Performed at [facility], [address] [place], [place] [address]  Resp panel by RT-PCR (RSV, Flu A&B, Covid) Anterior Nasal Swab     Status: None   Collection Time: 06/12/22  6:46 PM   Specimen: Anterior Nasal Swab  Result Value Ref Range   SARS Coronavirus 2 by RT PCR NEGATIVE NEGATIVE    Comment: (NOTE) SARS-CoV-2 target nucleic acids are NOT DETECTED.  The SARS-CoV-2 RNA is generally detectable in upper respiratory specimens during the acute phase of infection. The lowest concentration of SARS-CoV-2 viral copies this  assay can detect is 138 copies/mL. A negative result does not preclude SARS-Cov-2 infection and should not be used as the sole basis for treatment or other patient management decisions. A negative result may occur with  improper specimen collection/handling, submission of specimen other than nasopharyngeal swab, presence of viral mutation(s) within the areas targeted by this assay, and inadequate number of viral copies(<138 copies/mL). A negative result must be combined with clinical observations, patient history, and epidemiological information. The expected result is Negative.  Fact Sheet for Patients:  EntrepreneurPulse.com.au  Fact Sheet for Healthcare Providers:  SeriousBroker.ithttps://www.fda.gov/media/152162/download  This test is no t yet approved or cleared by the [place] FDA and  has been authorized for detection and/or diagnosis of SARS-CoV-2 by FDA under an Emergency Use Authorization (EUA). This EUA will remain  in effect (meaning this test can be used) for the duration of the COVID-19 declaration under Section 564(b)(1) of the Act, 21 U.S.C.section 360bbb-3(b)(1), unless the authorization is terminated  or revoked sooner.       Influenza A by PCR NEGATIVE NEGATIVE   Influenza B by PCR NEGATIVE NEGATIVE    Comment: (NOTE) The Xpert Xpress SARS-CoV-2/FLU/RSV plus assay is intended as an aid in the diagnosis of influenza from Nasopharyngeal swab specimens and should not be used as a sole basis for treatment. Nasal washings and aspirates are unacceptable for Xpert Xpress SARS-CoV-2/FLU/RSV testing.  Fact Sheet for Patients: BloggerCourse.comhttps://www.fda.gov/media/152166/download  Fact Sheet for Healthcare Providers: SeriousBroker.ithttps://www.fda.gov/media/152162/download  This test is not yet approved or cleared by the [place] FDA and has been authorized for detection and/or diagnosis of SARS-CoV-2 by FDA under an Emergency Use Authorization (EUA). This EUA will remain in  effect (meaning this test can be used) for the duration of the COVID-19 declaration under Section 564(b)(1) of the Act, 21 U.S.C. section 360bbb-3(b)(1), unless the authorization is terminated or revoked.     Resp Syncytial Virus by PCR NEGATIVE NEGATIVE    Comment: (NOTE) Fact Sheet for Patients: BloggerCourse.comhttps://www.fda.gov/media/152166/download  Fact Sheet for Healthcare Providers: SeriousBroker.ithttps://www.fda.gov/media/152162/download  This test is not yet approved or cleared by the [place] FDA and has been authorized for detection and/or diagnosis of SARS-CoV-2 by FDA under an Emergency Use Authorization (EUA). This EUA will remain in effect (meaning this test can be used) for the duration of the COVID-19 declaration under Section 564(b)(1) of the Act, 21 U.S.C. section 360bbb-3(b)(1), unless the authorization is terminated or revoked.  Performed at [facility], [address] [place], [place] [id number]     No current facility-administered medications for this encounter.   Current Outpatient Medications  Medication Sig Dispense Refill   norelgestromin-ethinyl estradiol Burr Medico(XULANE) 150-35 MCG/24HR transdermal patch Place 1 patch onto the skin once a week. 3 patch 4    Musculoskeletal: Strength & Muscle Tone: within normal limits Gait & Station: normal Patient leans: N/A  Psychiatric Specialty Exam:  Presentation  General Appearance:  Appropriate for Environment  Eye Contact: Good  Speech: Clear and Coherent  Speech Volume: Normal  Handedness: Right   Mood and Affect  Mood: Depressed  Affect: Depressed; Congruent   Thought Process  Thought Processes: Coherent  Descriptions of Associations:Intact  Orientation:Full (Time, Place and Person)  Thought Content:Logical  History of Schizophrenia/Schizoaffective disorder:No data recorded Duration of Psychotic Symptoms:No data recorded Hallucinations:Hallucinations: None  Ideas of  Reference:None  Suicidal Thoughts:Suicidal Thoughts: No  Homicidal Thoughts:Homicidal Thoughts: No   Sensorium  Memory: Immediate Good; Recent Good; Remote Good  Judgment: Good  Insight: Good   Executive Functions  Concentration: Good  Attention Span: Good  Recall: Good  Fund of Knowledge: Good  Language: Good   Psychomotor Activity  Psychomotor Activity: Psychomotor Activity: Normal   Assets  Assets: Communication Skills; Desire for Improvement; Resilience; Social Support   Sleep  Sleep: Sleep: Fair Number of Hours of Sleep: 8   Physical Exam: Physical Exam Vitals and nursing note reviewed.  Constitutional:      Appearance: Normal appearance. She is normal weight.  HENT:     Head: Normocephalic and atraumatic.     Right Ear: External  ear normal.     Left Ear: External ear normal.     Nose: Nose normal.     Mouth/Throat:     Mouth: Mucous membranes are moist.  Cardiovascular:     Rate and Rhythm: Tachycardia present.  Pulmonary:     Effort: Pulmonary effort is normal.  Musculoskeletal:        General: Normal range of motion.     Cervical back: Normal range of motion and neck supple.  Neurological:     General: No focal deficit present.     Mental Status: She is alert and oriented to person, place, and time.  Psychiatric:        Attention and Perception: Attention and perception normal.        Mood and Affect: Mood is anxious and depressed. Affect is flat.        Speech: Speech normal.        Behavior: Behavior normal. Behavior is cooperative.        Thought Content: Thought content normal.        Judgment: Judgment normal.    ROS Blood pressure (!) 131/116, pulse (!) 122, temperature 98.6 F (37 C), temperature source Oral, resp. rate 20, height 5\' 2"  (1.575 m), weight 83.9 kg, last menstrual period 05/28/2022, SpO2 100 %. Body mass index is 33.84 kg/m.  Treatment Plan Summary: Plan   Patient does not meet the criteria for  psychiatric inpatient admission  Disposition: No evidence of imminent risk to self or others at present.   Patient does not meet criteria for psychiatric inpatient admission. Supportive therapy provided about ongoing stressors.  Caroline Sauger, NP 06/12/2022 10:59 PM

## 2022-06-13 DIAGNOSIS — F4323 Adjustment disorder with mixed anxiety and depressed mood: Secondary | ICD-10-CM

## 2022-06-13 NOTE — ED Notes (Signed)
Pt given breakfast and beverage. Pt ate 100%. 

## 2022-06-13 NOTE — ED Notes (Signed)
Nurse talked to patient and she is pleasant, she states that she feels very depressed lately, lost her uncle, and has lost several people over the last 2 years. Patient states she does go to counseling and the more she talks about her issues the more she realizes that she needs help, she states ' I just don't want to leave and not feel like I'm headed in the right direction. Patient has thoughts to Si/, but does not think she will act on it. Nurse will continue to monitor for safety. Camera surveillance in progress for safety.

## 2022-06-13 NOTE — ED Provider Notes (Signed)
Vitals:   06/12/22 1729 06/13/22 0808  BP: (!) 131/116 131/87  Pulse: (!) 122 89  Resp: 20   Temp: 98.6 F (37 C) 98.3 F (36.8 C)  SpO2: 100% 99%     Patient currently remaining in behavioral health course in the ED.  Currently resting sleeping, no distress.  Awaiting psychiatric reassessment this morning.  Overnight it is noted that Dr. Leonides Schanz recommended that the patient be reevaluated by psychiatry today.  Plan is for psychiatric reassessment today   Delman Kitten, MD 06/13/22 [phone]

## 2022-06-13 NOTE — ED Notes (Signed)
VOL/pending psych dispo

## 2022-06-13 NOTE — BH Assessment (Addendum)
Comprehensive Clinical Assessment (CCA) Note  06/13/2022 Maureen Castaneda [id number]  Chief Complaint: Patient is a 32 year old female presenting to [facility] ED voluntarily. Per triage note Pt here for psych eval. Pt is VOL. Pt reports SI denies HI. Pt denies etoh or drug use. Pt states life is a lot now. My uncle died on my birthday. Pt tearful pt cooperative. During assessment patient appears alert and oriented x4, calm and cooperative. Patient reports "I lost my uncle, I've been seeing a Veterinary surgeon since November, I lost my best friend too and I finally cracked." "I can't clear my head." Patient reports some self harm by "taking a little needle to poke myself just so that I can feel something, I wasn't trying to break skin or hurt myself I'm too scared to do that." Patient also reports some stress with grad school and also being employed as a Designer, multimedia. Patient reports living with her boyfriend and not getting along with him "I just don't like the yelling and how he talks to me." Patient reports that she sees her therapist once every 2 weeks, she denies any current mental health medications. Patient was asked if she wanted to be admitted to Inpatient or if she felt safe going home, patient reported that she didn't feel like she needed to be here. Patient was asked about a crisis plan and patient denies having one but was provided resources in case of a crisis such as contacting [phone]. Patient was receptive to that plan. Patient denies SI/HI/AH/VH  Per Psyc NP Elenore Paddy patient is psyc cleared Chief Complaint  Patient presents with   Psychiatric Evaluation   Visit Diagnosis: Adjustment disorder with mixed anxiety and depressed mood    CCA Screening, Triage and Referral (STR)  Patient Reported Information How did you hear about [place]? Self  Referral name: No data recorded Referral phone number: No data recorded  Whom do you see for routine medical problems? No data  recorded Practice/Facility Name: No data recorded Practice/Facility Phone Number: No data recorded Name of Contact: No data recorded Contact Number: No data recorded Contact Fax Number: No data recorded Prescriber Name: No data recorded Prescriber Address (if known): No data recorded  What Is the Reason for Your Visit/Call Today? Pt here for psych eval.  Pt is VOL.  Pt reports SI   denies HI.  Pt denies etoh or drug use.  Pt states life is a lot now.  My uncle died on my birthday.  Pt tearful   pt cooperative.  How Long Has This Been Causing You Problems? > than 6 months  What Do You Feel Would Help You the Most Today? Treatment for Depression or other mood problem   Have You Recently Been in Any Inpatient Treatment (Hospital/Detox/Crisis Center/28-Day Program)? No data recorded Name/Location of Program/Hospital:No data recorded How Long Were You There? No data recorded When Were You Discharged? No data recorded  Have You Ever Received Services From [facility] Before? No data recorded Who Do You See at [facility]? No data recorded  Have You Recently Had Any Thoughts About Hurting Yourself? No  Are You Planning to Commit Suicide/Harm Yourself At This time? No   Have you Recently Had Thoughts About Hurting Someone Karolee Ohs? No  Explanation: No data recorded  Have You Used Any Alcohol or Drugs in the Past 24 Hours? No  How Long Ago Did You Use Drugs or Alcohol? No data recorded What Did You Use and How Much? No data recorded  Do You Currently Have a Therapist/Psychiatrist? Yes  Name of Therapist/Psychiatrist: Patient reports having a therapist   Have You Been Recently Discharged From Any Office Practice or Programs? No  Explanation of Discharge From Practice/Program: No data recorded    CCA Screening Triage Referral Assessment Type of Contact: Face-to-Face  Is this Initial or Reassessment? No data recorded Date Telepsych consult ordered in [facility]:  No data recorded Time  Telepsych consult ordered in [facility]:  No data recorded  Patient Reported Information Reviewed? No data recorded Patient Left Without Being Seen? No data recorded Reason for Not Completing Assessment: No data recorded  Collateral Involvement: No data recorded  Does Patient Have a Court Appointed Legal Guardian? No data recorded Name and Contact of Legal Guardian: No data recorded If Minor and Not Living with Parent(s), Who has Custody? No data recorded Is CPS involved or ever been involved? Never  Is APS involved or ever been involved? Never   Patient Determined To Be At Risk for Harm To Self or Others Based on Review of Patient Reported Information or Presenting Complaint? No  Method: No data recorded Availability of Means: No data recorded Intent: No data recorded Notification Required: No data recorded Additional Information for Danger to Others Potential: No data recorded Additional Comments for Danger to Others Potential: No data recorded Are There Guns or Other Weapons in Your Home? No  Types of Guns/Weapons: No data recorded Are These Weapons Safely Secured?                            No data recorded Who Could Verify You Are Able To Have These Secured: No data recorded Do You Have any Outstanding Charges, Pending Court Dates, Parole/Probation? No data recorded Contacted To Inform of Risk of Harm To Self or Others: No data recorded  Location of Assessment: [facility] ED   Does Patient Present under Involuntary Commitment? No  IVC Papers Initial File Date: No data recorded  [place] of Residence:    Patient Currently Receiving the Following Services: Individual Therapy   Determination of Need: Emergent (2 hours)   Options For Referral: No data recorded    CCA Biopsychosocial Intake/Chief Complaint:  No data recorded Current Symptoms/Problems: No data recorded  Patient Reported Schizophrenia/Schizoaffective Diagnosis in Past: No   Strengths: Patient is able  to communicate her needs  Preferences: No data recorded Abilities: No data recorded  Type of Services Patient Feels are Needed: No data recorded  Initial Clinical Notes/Concerns: No data recorded  Mental Health Symptoms Depression:   Change in energy/activity; Fatigue; Hopelessness   Duration of Depressive symptoms:  Greater than two weeks   Mania:   None   Anxiety:    Difficulty concentrating; Restlessness   Psychosis:   None   Duration of Psychotic symptoms: No data recorded  Trauma:   None   Obsessions:   None   Compulsions:   None   Inattention:   None   Hyperactivity/Impulsivity:   None   Oppositional/Defiant Behaviors:   None   Emotional Irregularity:   None   Other Mood/Personality Symptoms:  No data recorded   Mental Status Exam Appearance and self-care  Stature:   Average   Weight:   Overweight   Clothing:   Casual   Grooming:   Normal   Cosmetic use:   None   Posture/gait:   Normal   Motor activity:   Not Remarkable   Sensorium  Attention:  Normal   Concentration:   Normal   Orientation:   X5   Recall/memory:   Normal   Affect and Mood  Affect:   Appropriate   Mood:   Depressed   Relating  Eye contact:   Normal   Facial expression:   Responsive   Attitude toward examiner:   Cooperative   Thought and Language  Speech flow:  Clear and Coherent   Thought content:   Appropriate to Mood and Circumstances   Preoccupation:   None   Hallucinations:   None   Organization:  No data recorded  [employer] of Knowledge:   Fair   Intelligence:   Average   Abstraction:   Functional   Judgement:   Fair   Reality Testing:   Adequate   Insight:   Good   Decision Making:   Normal   Social Functioning  Social Maturity:   Responsible   Social Judgement:   Normal   Stress  Stressors:   Other (Comment)   Coping Ability:   Normal   Skill Deficits:   None    Supports:   Family; Friends/Service system     Religion: Religion/Spirituality Are You A Religious Person?: No  Leisure/Recreation: Leisure / Recreation Do You Have Hobbies?: No  Exercise/Diet: Exercise/Diet Do You Exercise?: No Have You Gained or Lost A Significant Amount of Weight in the Past Six Months?: No Do You Follow a Special Diet?: No Do You Have Any Trouble Sleeping?: No   CCA Employment/Education Employment/Work Situation: Employment / Work Situation Employment Situation: Unemployed Patient's Job has Been Impacted by Current Illness: No Has Patient ever Been in Passenger transport manager?: No  Education: Education Is Patient Currently Attending School?: No Did You Have An Individualized Education Program (IIEP): No Did You Have Any Difficulty At [employer]?: No Patient's Education Has Been Impacted by Current Illness: No   CCA Family/Childhood History Family and Relationship History: Family history Marital status: Single Does patient have children?: No  Childhood History:  Childhood History Did patient suffer any verbal/emotional/physical/sexual abuse as a child?: No Did patient suffer from severe childhood neglect?: No Has patient ever been sexually abused/assaulted/raped as an adolescent or adult?: No Was the patient ever a victim of a crime or a disaster?: No Witnessed domestic violence?: No Has patient been affected by domestic violence as an adult?: No  Child/Adolescent Assessment:     CCA Substance Use Alcohol/Drug Use: Alcohol / Drug Use Pain Medications: See MAR Prescriptions: See MAR Over the Counter: See MAR History of alcohol / drug use?: No history of alcohol / drug abuse                         ASAM's:  Six Dimensions of Multidimensional Assessment  Dimension 1:  Acute Intoxication and/or Withdrawal Potential:      Dimension 2:  Biomedical Conditions and Complications:      Dimension 3:  Emotional, Behavioral, or Cognitive  Conditions and Complications:     Dimension 4:  Readiness to Change:     Dimension 5:  Relapse, Continued use, or Continued Problem Potential:     Dimension 6:  Recovery/Living Environment:     ASAM Severity Score:    ASAM Recommended Level of Treatment:     Substance use Disorder (SUD)    Recommendations for Services/Supports/Treatments:    DSM5 Diagnoses: Patient Active Problem List   Diagnosis Date Noted   Adjustment disorder with mixed anxiety and depressed mood  12/21/2021   Obesity BMI=33.6 11/16/2021   Elevated blood pressure reading on 11/16/21 128/91 11/16/2021   Chlamydia 07/31/21 11/16/2021   Depression dx'd 2010 11/16/2021   Seizure disorder ([place]) 05/11/2020    Patient Centered Plan: Patient is on the following Treatment Plan(s):  Anxiety   Referrals to Alternative Service(s): Referred to Alternative Service(s):   Place:   Date:   Time:    Referred to Alternative Service(s):   Place:   Date:   Time:    Referred to Alternative Service(s):   Place:   Date:   Time:    Referred to Alternative Service(s):   Place:   Date:   Time:      @[facility] @  [employer], LCAS-A

## 2022-06-13 NOTE — ED Notes (Signed)
This EDT asked pt if she would like to shower. Pt states no thank you, I will take it once I get home.

## 2022-06-13 NOTE — Consult Note (Signed)
[facility] ED Progress Note  06/13/2022 10:45 AM Maureen Castaneda  MRN:  [id number]   Method of visit?: Face to Face   Subjective: " I came here for a spiral of things ". Patient presented to the ED voluntarily reporting progressive worsening of mood over several months, with recent intensification of depressive symptoms and previous suicidal ideation but denies intent. Despite therapy sessions, patient felt overwhelmed by a "spiral of things" and had difficulty slowing down thoughts. She reported previously being on Lamictal for seizures which also improved mood, but currently not taking any mood stabilizers. Patient reported she was recently started on Adderall by PCP for focus issues.  On evaluation, the patient is observed sitting up in bed watching television. She is alert and oriented x 4, pleasant, engages easily in conversation, and mood congruent with affect.  She is speaking in clear coherent sentences. The patient denies current suicidal or homicidal ideation. She mentions frequently being preoccupied with the loss of her uncle and best friend. Patient reports she often uses a needle to poke herself because she wants to feel the pain. Positive coping mechanisms discussed with patient, including baking, which she reports that she enjoys. 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 The patient does not appear to be responding to internal or external stimuli. No evidence of delusions noted. Patient reports she lives with boyfriend who is supportive; has local family but does not discuss personal issues with them. She denies abuse, but mentions tension and arguments with boyfriend. Patient denies having any weapons at home.   Principal Problem: <principal problem not specified> Diagnosis:  Active Problems:   Seizure disorder (HCC)   Depression dx'd 2010   Adjustment disorder with mixed anxiety and depressed mood  Total Time spent with patient: 20 minutes  Past Psychiatric History:   Seizures (HCC)  Past Medical History:  Past Medical History:  Diagnosis Date   Seizures (HCC)     Past Surgical History:  Procedure Laterality Date   TONSILLECTOMY     TUMOR REMOVAL     WISDOM TOOTH EXTRACTION     Family History:  Family History  Problem Relation Age of Onset   Migraines Mother    Congenital heart disease Mother    Depression Mother    Anxiety disorder Mother    ADD / ADHD Mother    Anxiety disorder Father    ADD / ADHD Sister    Anxiety disorder Sister    Depression Sister    Diabetes Maternal Grandmother    Cancer Maternal Grandfather        kidney   Clotting disorder Maternal Grandfather    Lung disease Paternal Grandfather    Kidney disease Paternal Grandfather    Cancer Maternal Aunt        Brain   Cancer Paternal Aunt        Brain and cervical   Cancer Paternal Aunt        Breast   Cancer Cousin        Thyroid   Family Psychiatric  History: Per chart review  Mother - Depression, ADD, Anxiety disorder Father - Anxiety disorder Sister - Depression, ADD, Anxiety disorder  Social History:  Social History   Substance and Sexual Activity  Alcohol Use Yes   Alcohol/week: 4.0 standard drinks of alcohol   Types: 4 Standard drinks or equivalent per week   Comment: last use 11/12/21 q weekend     Social History   Substance and Sexual Activity  Drug Use Not Currently   Types: Marijuana   Comment: last use 2021    Social History   Socioeconomic History   Marital status: Single    Spouse name: Not on file   Number of children: Not on file   Years of education: Not on file   Highest education level: Not on file  Occupational History   Not on file  Tobacco Use   Smoking status: Former    Types: E-cigarettes   Smokeless tobacco: Never  Vaping Use   Vaping Use: Never used  Substance and Sexual Activity   Alcohol use: Yes    Alcohol/week: 4.0 standard drinks of alcohol    Types: 4 Standard drinks or equivalent per week    Comment:  last use 11/12/21 q weekend   Drug use: Not Currently    Types: Marijuana    Comment: last use 2021   Sexual activity: Yes    Partners: Male    Birth control/protection: Patch  Other Topics Concern   Not on file  Social History Narrative   Not on file   Social Determinants of Health   Financial Resource Strain: Not on file  Food Insecurity: Not on file  Transportation Needs: Not on file  Physical Activity: Not on file  Stress: Not on file  Social Connections: Not on file    Sleep: Fair  Appetite:  Good  Current Medications: No current facility-administered medications for this encounter.   Current Outpatient Medications  Medication Sig Dispense Refill   norelgestromin-ethinyl estradiol [facility]) 150-35 MCG/24HR transdermal patch Place 1 patch onto the skin once a week. 3 patch 4    Lab Results:  Results for orders placed or performed during the hospital encounter of 06/12/22 (from the past 48 hour(s))  Urinalysis, Routine w reflex microscopic     Status: Abnormal   Collection Time: 06/12/22  5:29 PM  Result Value Ref Range   Color, Urine YELLOW (A) YELLOW   APPearance HAZY (A) CLEAR   Specific Gravity, Urine 1.013 1.005 - 1.030   pH 7.0 5.0 - 8.0   Glucose, UA NEGATIVE NEGATIVE mg/dL   Hgb urine dipstick NEGATIVE NEGATIVE   Bilirubin Urine NEGATIVE NEGATIVE   Ketones, ur 20 (A) NEGATIVE mg/dL   Protein, ur NEGATIVE NEGATIVE mg/dL   Nitrite NEGATIVE NEGATIVE   Leukocytes,Ua NEGATIVE NEGATIVE    Comment: Performed at [facility], [address] [place], [place] [address]  Comprehensive metabolic panel     Status: Abnormal   Collection Time: 06/12/22  5:29 PM  Result Value Ref Range   Sodium 137 135 - 145 mmol/L   Potassium 3.4 (L) 3.5 - 5.1 mmol/L   Chloride 104 98 - 111 mmol/L   CO2 22 22 - 32 mmol/L   Glucose, Bld 115 (H) 70 - 99 mg/dL    Comment: Glucose reference range applies only to samples taken after fasting for at least 8 hours.   BUN 10 6  - 20 mg/dL   Creatinine, Ser 4.27 0.44 - 1.00 mg/dL   Calcium 9.2 8.9 - 06.2 mg/dL   Total Protein 8.0 6.5 - 8.1 g/dL   Albumin 4.4 3.5 - 5.0 g/dL   AST 32 15 - 41 U/L   ALT 31 0 - 44 U/L   Alkaline Phosphatase 49 38 - 126 U/L   Total Bilirubin 0.6 0.3 - 1.2 mg/dL   GFR, Estimated >37 >62 mL/min    Comment: (NOTE) Calculated using the CKD-EPI Creatinine Equation (2021)  Anion gap 11 5 - 15    Comment: Performed at [facility], [address] [place], [place] [address]  CBC with Differential     Status: None   Collection Time: 06/12/22  5:29 PM  Result Value Ref Range   WBC 8.9 4.0 - 10.5 K/uL   RBC 4.32 3.87 - 5.11 MIL/uL   Hemoglobin 13.7 12.0 - 15.0 g/dL   HCT 19.140.9 47.836.0 - 29.546.0 %   MCV 94.7 80.0 - 100.0 fL   MCH 31.7 26.0 - 34.0 pg   MCHC 33.5 30.0 - 36.0 g/dL   RDW 62.112.6 30.811.5 - 65.715.5 %   Platelets 273 150 - 400 K/uL   nRBC 0.0 0.0 - 0.2 %   Neutrophils Relative % 64 %   Neutro Abs 5.7 1.7 - 7.7 K/uL   Lymphocytes Relative 30 %   Lymphs Abs 2.7 0.7 - 4.0 K/uL   Monocytes Relative 6 %   Monocytes Absolute 0.5 0.1 - 1.0 K/uL   Eosinophils Relative 0 %   Eosinophils Absolute 0.0 0.0 - 0.5 K/uL   Basophils Relative 0 %   Basophils Absolute 0.0 0.0 - 0.1 K/uL   Immature Granulocytes 0 %   Abs Immature Granulocytes 0.04 0.00 - 0.07 K/uL    Comment: Performed at [facility], [address] [place], [place] [address]  Urine Drug Screen, Qualitative     Status: None   Collection Time: 06/12/22  5:29 PM  Result Value Ref Range   Tricyclic, Ur Screen NONE DETECTED NONE DETECTED   Amphetamines, Ur Screen NONE DETECTED NONE DETECTED   MDMA (Ecstasy)Ur Screen NONE DETECTED NONE DETECTED   Cocaine Metabolite,Ur [place] NONE DETECTED NONE DETECTED   Opiate, Ur Screen NONE DETECTED NONE DETECTED   Phencyclidine (PCP) Ur S NONE DETECTED NONE DETECTED   Cannabinoid 50 Ng, Ur [place] NONE DETECTED NONE DETECTED   Barbiturates, Ur Screen NONE DETECTED NONE DETECTED    Benzodiazepine, Ur Scrn NONE DETECTED NONE DETECTED   Methadone Scn, Ur NONE DETECTED NONE DETECTED    Comment: (NOTE) Tricyclics + metabolites, urine    Cutoff 1000 ng/mL Amphetamines + metabolites, urine  Cutoff 1000 ng/mL MDMA (Ecstasy), urine              Cutoff 500 ng/mL Cocaine Metabolite, urine          Cutoff 300 ng/mL Opiate + metabolites, urine        Cutoff 300 ng/mL Phencyclidine (PCP), urine         Cutoff 25 ng/mL Cannabinoid, urine                 Cutoff 50 ng/mL Barbiturates + metabolites, urine  Cutoff 200 ng/mL Benzodiazepine, urine              Cutoff 200 ng/mL Methadone, urine                   Cutoff 300 ng/mL  The urine drug screen provides only a preliminary, unconfirmed analytical test result and should not be used for non-medical purposes. Clinical consideration and professional judgment should be applied to any positive drug screen result due to possible interfering substances. A more specific alternate chemical method must be used in order to obtain a confirmed analytical result. Gas chromatography / mass spectrometry (GC/MS) is the preferred confirm atory method. Performed at [facility], [address] [place], [place] [address]   Ethanol     Status: None   Collection Time:  06/12/22  5:29 PM  Result Value Ref Range   Alcohol, Ethyl (B) <10 <10 mg/dL    Comment: (NOTE) Lowest detectable limit for serum alcohol is 10 mg/dL.  For medical purposes only. Performed at [facility], [address] [place], [place] [address]   Salicylate level     Status: Abnormal   Collection Time: 06/12/22  5:29 PM  Result Value Ref Range   Salicylate Lvl <4.0 (L) 7.0 - 30.0 mg/dL    Comment: Performed at [facility], [address] [place], [place] [address]  Acetaminophen level     Status: Abnormal   Collection Time: 06/12/22  5:29 PM  Result Value Ref Range   Acetaminophen (Tylenol), Serum <10 (L) 10 - 30 ug/mL    Comment:  (NOTE) Therapeutic concentrations vary significantly. A range of 10-30 ug/mL  may be an effective concentration for many patients. However, some  are best treated at concentrations outside of this range. Acetaminophen concentrations >150 ug/mL at 4 hours after ingestion  and >50 ug/mL at 12 hours after ingestion are often associated with  toxic reactions.  Performed at [facility], [address] [place], [place] [address]   Pregnancy, urine     Status: None   Collection Time: 06/12/22  5:29 PM  Result Value Ref Range   Preg Test, Ur NEGATIVE NEGATIVE    Comment: Performed at [facility], [address] [place], [place] [address]  Resp panel by RT-PCR (RSV, Flu A&B, Covid) Anterior Nasal Swab     Status: None   Collection Time: 06/12/22  6:46 PM   Specimen: Anterior Nasal Swab  Result Value Ref Range   SARS Coronavirus 2 by RT PCR NEGATIVE NEGATIVE    Comment: (NOTE) SARS-CoV-2 target nucleic acids are NOT DETECTED.  The SARS-CoV-2 RNA is generally detectable in upper respiratory specimens during the acute phase of infection. The lowest concentration of SARS-CoV-2 viral copies this assay can detect is 138 copies/mL. A negative result does not preclude SARS-Cov-2 infection and should not be used as the sole basis for treatment or other patient management decisions. A negative result may occur with  improper specimen collection/handling, submission of specimen other than nasopharyngeal swab, presence of viral mutation(s) within the areas targeted by this assay, and inadequate number of viral copies(<138 copies/mL). A negative result must be combined with clinical observations, patient history, and epidemiological information. The expected result is Negative.  Fact Sheet for Patients:  EntrepreneurPulse.com.au  Fact Sheet for Healthcare Providers:  IncredibleEmployment.be  This test is no t yet approved or cleared by  the [place] FDA and  has been authorized for detection and/or diagnosis of SARS-CoV-2 by FDA under an Emergency Use Authorization (EUA). This EUA will remain  in effect (meaning this test can be used) for the duration of the COVID-19 declaration under Section 564(b)(1) of the Act, 21 U.S.C.section 360bbb-3(b)(1), unless the authorization is terminated  or revoked sooner.       Influenza A by PCR NEGATIVE NEGATIVE   Influenza B by PCR NEGATIVE NEGATIVE    Comment: (NOTE) The Xpert Xpress SARS-CoV-2/FLU/RSV plus assay is intended as an aid in the diagnosis of influenza from Nasopharyngeal swab specimens and should not be used as a sole basis for treatment. Nasal washings and aspirates are unacceptable for Xpert Xpress SARS-CoV-2/FLU/RSV testing.  Fact Sheet for Patients: EntrepreneurPulse.com.au  Fact Sheet for Healthcare Providers: IncredibleEmployment.be  This test is not yet approved or cleared by the [place] FDA  and has been authorized for detection and/or diagnosis of SARS-CoV-2 by FDA under an Emergency Use Authorization (EUA). This EUA will remain in effect (meaning this test can be used) for the duration of the COVID-19 declaration under Section 564(b)(1) of the Act, 21 U.S.C. section 360bbb-3(b)(1), unless the authorization is terminated or revoked.     Resp Syncytial Virus by PCR NEGATIVE NEGATIVE    Comment: (NOTE) Fact Sheet for Patients: BloggerCourse.com  Fact Sheet for Healthcare Providers: SeriousBroker.it  This test is not yet approved or cleared by the [place] FDA and has been authorized for detection and/or diagnosis of SARS-CoV-2 by FDA under an Emergency Use Authorization (EUA). This EUA will remain in effect (meaning this test can be used) for the duration of the COVID-19 declaration under Section 564(b)(1) of the Act, 21 U.S.C. section  360bbb-3(b)(1), unless the authorization is terminated or revoked.  Performed at [facility], [address] [place], [place] [address]     Blood Alcohol level:  Lab Results  Component Value Date   ETH <10 06/12/2022   [facility]  06/30/2010    <5        LOWEST DETECTABLE LIMIT FOR SERUM ALCOHOL IS 5 mg/dL FOR MEDICAL PURPOSES ONLY    Physical Findings: AIMS:  , ,  ,  ,    CIWA:    COWS:     Musculoskeletal: Strength & Muscle Tone: within normal limits Gait & Station: normal Patient leans: N/A  Psychiatric Specialty Exam:  Presentation  General Appearance:  Appropriate for Environment  Eye Contact: Good  Speech: Clear and Coherent  Speech Volume: Normal  Handedness: Right   Mood and Affect  Mood: Euthymic  Affect: Depressed; Congruent   Thought Process  Thought Processes: Coherent  Descriptions of Associations:Intact  Orientation:Full (Time, Place and Person)  Thought Content:Logical  History of Schizophrenia/Schizoaffective disorder:No  Duration of Psychotic Symptoms:No data recorded Hallucinations:Hallucinations: None  Ideas of Reference:None  Suicidal Thoughts:Suicidal Thoughts: No  Homicidal Thoughts:Homicidal Thoughts: No   Sensorium  Memory: Immediate Good; Recent Good; Remote Good  Judgment: Good  Insight: Good   Executive Functions  Concentration: Good  Attention Span: Good  Recall: Good  Fund of Knowledge: Good  Language: Good   Psychomotor Activity  Psychomotor Activity: Psychomotor Activity: Normal   Assets  Assets: Communication Skills; Desire for Improvement; Housing; Resilience; Social Support   Sleep  Sleep: Sleep: Fair Number of Hours of Sleep: 8    Physical Exam: Physical Exam HENT:     Head: Normocephalic.     Nose: Nose normal.  Pulmonary:     Effort: Pulmonary effort is normal.  Musculoskeletal:        General: Normal range of motion.     Cervical back: Normal  range of motion.  Skin:    General: Skin is dry.  Neurological:     General: No focal deficit present.     Mental Status: She is alert.    Review of Systems  Constitutional: Negative.   HENT: Negative.    Respiratory: Negative.    Musculoskeletal: Negative.   Neurological: Negative.   Psychiatric/Behavioral:  Positive for depression. Negative for hallucinations, substance abuse and suicidal ideas.    Blood pressure 131/87, pulse 89, temperature 98.3 F (36.8 C), temperature source Oral, resp. rate 20, height 5\' 2"  (1.575 m), weight 83.9 kg, last menstrual period 05/28/2022, SpO2 99 %. Body mass index is 33.84 kg/m.  Treatment Plan Summary: Plan Discussed the option of psychiatric inpatient hospitalization, patient respectfully  declined and prefers to seek intensive outpatient treatment. Patient does not meet IVC criteria for psychiatric inpatient hospitalization at present.  No imminent risk of danger to self or others.  Recommend intensive outpatient treatment. Will provide resources for follow-up care and support. Reviewed with Dr. Jacqualine Code.     Ronny Flurry, NP 06/13/2022, 10:45 AM

## 2022-06-13 NOTE — ED Provider Notes (Signed)
Psychiatric nurse practitioner, NP Richardson Landry has reassessed the patient.  Patient does not require inpatient treatment at this time, and the patient is requesting outpatient treatment as well.  Patient is being discharged with plan to follow-up with outpatient resources.   Maureen Kitten, MD 06/13/22 [phone]

## 2022-06-13 NOTE — ED Notes (Signed)
Patient received all of her belongings back, she is calm and cooperative, Patient had her car here, and she states she is transporting back to her home. Patient also states she will follow up with her counselor.

## 2023-10-02 ENCOUNTER — Ambulatory Visit (INDEPENDENT_AMBULATORY_CARE_PROVIDER_SITE_OTHER): Payer: No Typology Code available for payment source | Admitting: Neurology

## 2023-10-02 ENCOUNTER — Encounter: Payer: Self-pay | Admitting: Neurology

## 2023-10-02 VITALS — BP 126/87 | HR 90 | Ht 63.0 in | Wt 194.0 lb

## 2023-10-02 DIAGNOSIS — R569 Unspecified convulsions: Secondary | ICD-10-CM

## 2023-10-02 DIAGNOSIS — Q043 Other reduction deformities of brain: Secondary | ICD-10-CM

## 2023-10-02 NOTE — Progress Notes (Signed)
 [facility]  PATIENT: Maureen Castaneda DOB: [birth year]  REQUESTING CLINICIAN: Scarlett Current, PA-C HISTORY FROM: Patient  REASON FOR VISIT: Seizure    HISTORICAL  CHIEF COMPLAINT:  Chief Complaint  Patient presents with   New Patient (Initial Visit)    Pt in room 13, alone.  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 Pt reports no seizures in at least 5 year.     HISTORY OF PRESENT ILLNESS:  This is a 34 year old woman past medical history of seizure disorder who is presenting to establish care.  She tells me her first seizure occurred in 2010, this was while he was she was at the gym and described it as a generalized seizure.  She was seen in the hospital, follow-up with neurology and was not started on medication at that time because it was her first seizure.  She tells me she was doing well until about 8 years ago when she did have a nocturnal seizure, she remembers waking up to parents putting her into a car and taken to the hospital.  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  Started on lamotrigine, but due to cost of medication and lack of insurance she had discontinued Lamotrigine.  She has not been on medication for the past 5 years and has not had any additional seizures.  Denies waking up with blood on the pillow, denies urinary incontinence, no other complaint or concerns.   Handedness: Right handed   Onset: 2010   Seizure Type: Generalized convulsion   Current frequency: only 2 seizures, 2010 and the last one was 8 years ago   Any injuries from seizures: None   Seizure risk factors: Paternal uncle   Previous ASMs: Lamotrigine, Levetiracetam   Currenty ASMs: None  ASMs side effects: N/A  Brain Images: polymicrogyria   Previous EEGs: Not available for review    OTHER MEDICAL CONDITIONS: None   REVIEW OF SYSTEMS: Full 14 system review of systems performed and negative with exception of: As reported in the PI   ALLERGIES: No  Known Allergies  HOME MEDICATIONS: Outpatient Medications Prior to Visit  Medication Sig Dispense Refill   meloxicam (MOBIC) 15 MG tablet Take 15 mg by mouth daily.     norelgestromin-ethinyl estradiol (XULANE ) 150-35 MCG/24HR transdermal patch Place 1 patch onto the skin once a week. 3 patch 4   dicyclomine (BENTYL) 10 MG capsule Take 10 mg by mouth in the morning, at noon, in the evening, and at bedtime.     folic acid (FOLVITE) 1 MG tablet Take 2 mg by mouth daily.     hydrocortisone-pramoxine (PROCTOFOAM-HC) rectal foam Place 1 applicator rectally 2 (two) times daily.     lamoTRIgine (LAMICTAL XR) 100 MG 24 hour tablet Take 300 mg by mouth daily.     promethazine (PHENERGAN) 25 MG tablet Take 25 mg by mouth every 6 (six) hours as needed for nausea.     Ferrous Sulfate (IRON PO) Take 1 tablet by mouth daily.     No facility-administered medications prior to visit.    PAST MEDICAL HISTORY: Past Medical History:  Diagnosis Date   Seizures (HCC)     PAST SURGICAL HISTORY: Past Surgical History:  Procedure Laterality Date   TONSILLECTOMY     TUMOR REMOVAL     WISDOM TOOTH EXTRACTION      FAMILY HISTORY: Family History  Problem Relation Age of Onset   Migraines Mother    Congenital heart disease Mother    Depression  Mother    Anxiety disorder Mother    ADD / ADHD Mother    Anxiety disorder Father    ADD / ADHD Sister    Anxiety disorder Sister    Depression Sister    Diabetes Maternal Grandmother    Cancer Maternal Grandfather        kidney   Clotting disorder Maternal Grandfather    Lung disease Paternal Grandfather    Kidney disease Paternal Grandfather    Cancer Maternal Aunt        Brain   Cancer Paternal Aunt        Brain and cervical   Cancer Paternal Aunt        Breast   Cancer Cousin        Thyroid    SOCIAL HISTORY: Social History   Socioeconomic History   Marital status: Single    Spouse name: Not on file   Number of children: Not on file    Years of education: Not on file   Highest education level: Not on file  Occupational History   Not on file  Tobacco Use   Smoking status: Former    Types: E-cigarettes   Smokeless tobacco: Never  Vaping Use   Vaping status: Never Used  Substance and Sexual Activity   Alcohol use: Yes    Alcohol/week: 4.0 standard drinks of alcohol    Types: 4 Standard drinks or equivalent per week    Comment: last use 11/12/21 q weekend   Drug use: Not Currently    Types: Marijuana    Comment: last use 2021   Sexual activity: Yes    Partners: Male    Birth control/protection: Patch  Other Topics Concern   Not on file  Social History Narrative   Not on file   Social Drivers of Health   Financial Resource Strain: Low Risk  (04/24/2021)   Received from [facility] visits prior to 07/21/2022.   Overall Financial Resource Strain (CARDIA)  Food Insecurity: Low Risk  (07/10/2023)   Received from [facility]   Hunger Vital Sign    Worried About Running Out of Food in the Last Year: Never true    Ran Out of Food in the Last Year: Never true  Transportation Needs: No Transportation Needs (07/10/2023)   Received from [facility]    In the past 12 months, has lack of reliable transportation kept you from medical appointments, meetings, work or from getting things needed for daily living? : No  Physical Activity: Sufficiently Active (04/24/2021)   Received from [facility] visits prior to 07/21/2022.   Exercise Vital Sign  Stress: Stress Concern Present (04/24/2021)   Received from [facility] visits prior to 07/21/2022.   [employer] of Occupational Health - Occupational Stress Questionnaire  Social Connections: Unknown (09/22/2021)   Received from [facility]   Social Network    Social Network: Not on file  Intimate Partner Violence: Not At Risk (11/16/2021)   Humiliation, Afraid, Rape, and Kick  questionnaire    Fear of Current or Ex-Partner: No    Emotionally Abused: No    Physically Abused: No    Sexually Abused: No    PHYSICAL EXAM  GENERAL EXAM/CONSTITUTIONAL: Vitals:  Vitals:   10/02/23 1454  BP: 126/87  Pulse: 90  Weight: 194 lb (88 kg)  Height: 5\' 3"  (1.6 m)   Body mass index is 34.37 kg/m. Wt Readings  from Last 3 Encounters:  10/02/23 194 lb (88 kg)  06/12/22 185 lb (83.9 kg)  11/16/21 184 lb 3.2 oz (83.6 kg)   Patient is in no distress; well developed, nourished and groomed; neck is supple  MUSCULOSKELETAL: Gait, strength, tone, movements noted in Neurologic exam below  NEUROLOGIC: MENTAL STATUS:      No data to display         awake, alert, oriented to person, place and time recent and remote memory intact normal attention and concentration language fluent, comprehension intact, naming intact fund of knowledge appropriate  CRANIAL NERVE:  2nd, 3rd, 4th, 6th - Visual fields full to confrontation, extraocular muscles intact, no nystagmus 5th - facial sensation symmetric 7th - facial strength symmetric 8th - hearing intact 9th - palate elevates symmetrically, uvula midline 11th - shoulder shrug symmetric 12th - tongue protrusion midline  MOTOR:  normal bulk and tone, full strength in the BUE, BLE  SENSORY:  normal and symmetric to light touch  COORDINATION:  finger-nose-finger, fine finger movements normal  GAIT/STATION:  normal   DIAGNOSTIC DATA (LABS, IMAGING, TESTING) - I reviewed patient records, labs, notes, testing and imaging myself where available.  Lab Results  Component Value Date   WBC 8.9 06/12/2022   HGB 13.7 06/12/2022   HCT 40.9 06/12/2022   MCV 94.7 06/12/2022   PLT 273 06/12/2022      Component Value Date/Time   NA 137 06/12/2022 1729   K 3.4 (L) 06/12/2022 1729   CL 104 06/12/2022 1729   CO2 22 06/12/2022 1729   GLUCOSE 115 (H) 06/12/2022 1729   BUN 10 06/12/2022 1729   CREATININE 0.57 06/12/2022  1729   CALCIUM 9.2 06/12/2022 1729   PROT 8.0 06/12/2022 1729   ALBUMIN 4.4 06/12/2022 1729   AST 32 06/12/2022 1729   ALT 31 06/12/2022 1729   ALKPHOS 49 06/12/2022 1729   BILITOT 0.6 06/12/2022 1729   GFRNONAA >60 06/12/2022 1729   GFRAA  06/30/2010 1627    >60        The eGFR has been calculated using the MDRD equation. This calculation has not been validated in all clinical situations. eGFR's persistently <60 mL/min signify possible Chronic Kidney Disease.   No results found for: "CHOL", "HDL", "LDLCALC", "LDLDIRECT", "TRIG" No results found for: "HGBA1C" No results found for: "VITAMINB12" No results found for: "TSH"  MRI Brain 2015 Irregular pattern of nodularity of the cortex of the posterior aspect along the sylvian fissure bilaterally consistent with areas of polymicrogyria.    ASSESSMENT AND PLAN  33 y.o. year old female  with history of seizure and brain abnormality who is presented to establish care for her seizure disorder.  She tells me her last seizure was more than 8 years ago and she has been off medication for the past 5 years.  Plan will be to obtain a routine EEG, and an updated MRI brain because she did have an abnormal MRI in 2015 and has not had any additional follow-up MRIs.  If any abnormality in the EEG, we will consider restarting antiseizure medication, most likely lamotrigine because she did well on it.  Continue to follow-up with PCP, please contact us  if you do have another event, at that time we will likely start patient on lamotrigine.   1. 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  If abnormal, will likely start patient on antiseizure  medication, lamotrigine MRI brain with and without contrast Continue follow-up PCP Return as needed  Per [place]  DM[place] statutes, patients with seizures are not allowed to drive until they have been seizure-free for six months.   Other recommendations include using caution when using heavy equipment or power tools. Avoid working on ladders or at heights. Take showers instead of baths.  Do not swim alone.  Ensure the water temperature is not too high on the home water heater. Do not go swimming alone. Do not lock yourself in a room alone (i.e. bathroom). When caring for infants or small children, sit down when holding, feeding, or changing them to minimize risk of injury to the child in the event you have a seizure. Maintain good sleep hygiene. Avoid alcohol.  Also recommend adequate sleep, hydration, good diet and minimize stress.   During the Seizure  - First, ensure adequate ventilation and place patients on the floor on their left side  Loosen clothing around the neck and ensure the airway is patent. If the patient is clenching the teeth, do not force the mouth open with any object as this can cause severe damage - Remove all items from the surrounding that can be hazardous. The patient may be oblivious to what's happening and may not even know what he or she is doing. If the patient is confused and wandering, either gently guide him/her away and block access to outside areas - Reassure the individual and be comforting - Call 911. In most cases, the seizure ends before EMS arrives. However, there are cases when seizures may last over 3 to 5 minutes. Or the individual may have developed breathing difficulties or severe injuries. If a pregnant patient or a person with diabetes develops a seizure, it is prudent to call an ambulance. - Finally, if the patient does not regain full consciousness, then call EMS. Most patients will remain confused for about 45 to 90 minutes after a seizure, so you must use judgment in calling for help. - Avoid restraints but make sure the patient is in a bed with padded side rails - Place the individual in a lateral position with the neck slightly flexed; this will help the saliva drain from the  mouth and prevent the tongue from falling backward - Remove all nearby furniture and other hazards from the area - Provide verbal assurance as the individual is regaining consciousness - Provide the patient with privacy if possible - Call for help and start treatment as ordered by the caregiver   After the Seizure (Postictal Stage)  After a seizure, most patients experience confusion, fatigue, muscle pain and/or a headache. Thus, one should permit the individual to sleep. For the next few days, reassurance is essential. Being calm and helping reorient the person is also of importance.  Most seizures are painless and end spontaneously. Seizures are not harmful to others but can lead to complications such as stress on the lungs, brain and the heart. Individuals with prior lung problems may develop labored breathing and respiratory distress.    Discussed Patients with epilepsy have a small risk of sudden unexpected death, a condition referred to as sudden unexpected death in epilepsy (SUDEP). SUDEP is defined specifically as the sudden, unexpected, witnessed or unwitnessed, nontraumatic and nondrowning death in patients with epilepsy with or without evidence for a seizure, and excluding documented status epilepticus, in which post mortem examination does not reveal a structural or toxicologic cause for death     Orders Placed This Encounter  Procedures   MR BRAIN W WO CONTRAST   EEG adult    No orders of the defined types were placed in this encounter.   Return if symptoms worsen or fail to improve.    Cassandra Cleveland, MD 10/02/2023, 3:45 PM  [facility] [address], [address] [place], [place] [address] [phone]

## 2023-10-02 NOTE — Patient Instructions (Signed)
 Routine EEG, I will contact you to go over the result.  If abnormal, will likely start patient on antiseizure medication, lamotrigine MRI brain with and without contrast Continue follow-up PCP Return as needed

## 2023-10-07 ENCOUNTER — Telehealth: Payer: Self-pay | Admitting: Neurology

## 2023-10-07 ENCOUNTER — Ambulatory Visit: Payer: Self-pay | Admitting: Neurology

## 2023-10-07 ENCOUNTER — Ambulatory Visit: Admitting: Neurology

## 2023-10-07 DIAGNOSIS — R569 Unspecified convulsions: Secondary | ICD-10-CM | POA: Diagnosis not present

## 2023-10-07 NOTE — Procedures (Signed)
   History:  33 year old woman with seizure   EEG classification:  Awake and asleep  Duration: 25 minutes   Technical aspects: This EEG study was done with scalp electrodes positioned according to the 10-20 International system of electrode placement. Electrical activity was reviewed with band pass filter of 1-70Hz , sensitivity of 7 uV/mm, display speed of 87mm/sec with a 60Hz  notched filter applied as appropriate. EEG data were recorded continuously and digitally stored.   Description of the recording: The background rhythms of this recording consists of a fairly well modulated medium amplitude background activity of 10 Hz. As the record progresses, the patient initially is in the waking state, but appears to enter the early stage II sleep during the recording, with rudimentary sleep spindles and vertex sharp wave activity seen. During the wakeful state, photic stimulation was performed, and no abnormal responses were seen. Hyperventilation was also performed, no abnormal response seen. No epileptiform discharges seen during this recording. There was no focal slowing.   Abnormality: None   Impression: This is a normal awake and sleep EEG. No evidence of interictal epileptiform discharges. Normal EEGs, however, do not rule out epilepsy.    Birdella Sippel, MD [facility]

## 2023-10-07 NOTE — Telephone Encounter (Signed)
 [facility] [employer] [employer]: [id number] exp. 10/03/23-11/02/23 sent to Arlin Benes [phone]

## 2023-10-11 ENCOUNTER — Ambulatory Visit (HOSPITAL_COMMUNITY)
Admission: RE | Admit: 2023-10-11 | Discharge: 2023-10-11 | Disposition: A | Discharge status: Home / Self Care | Source: Ambulatory Visit | Attending: Neurology | Admitting: Neurology

## 2023-10-11 DIAGNOSIS — R569 Unspecified convulsions: Secondary | ICD-10-CM | POA: Diagnosis present

## 2023-10-11 DIAGNOSIS — Q043 Other reduction deformities of brain: Secondary | ICD-10-CM | POA: Insufficient documentation

## 2023-10-11 MED ORDER — GADOBUTROL 1 MMOL/ML IV SOLN
8.0000 mL | Freq: Once | INTRAVENOUS | Status: AC | PRN
  Administered 2023-10-11: 8 mL via INTRAVENOUS

## 2023-10-21 NOTE — Telephone Encounter (Signed)
 She will have to go back to the facility where she had the MRI and ask for the MRI to be put on a CD
